# Patient Record
Sex: Female | Born: 1990 | Race: Black or African American | Hispanic: No | Marital: Single | Smoking: Never smoker
Health system: Southern US, Community
[De-identification: ages and names within clinical notes are randomized; demographics above are authoritative.]

## PROBLEM LIST (undated history)

## (undated) ENCOUNTER — Inpatient Hospital Stay (HOSPITAL_COMMUNITY): Payer: Self-pay

## (undated) ENCOUNTER — Emergency Department (HOSPITAL_COMMUNITY): Admission: EM | Payer: Medicaid Other

## (undated) DIAGNOSIS — F32A Depression, unspecified: Secondary | ICD-10-CM

## (undated) DIAGNOSIS — F329 Major depressive disorder, single episode, unspecified: Secondary | ICD-10-CM

## (undated) DIAGNOSIS — L0232 Furuncle of buttock: Secondary | ICD-10-CM

## (undated) DIAGNOSIS — A749 Chlamydial infection, unspecified: Secondary | ICD-10-CM

## (undated) DIAGNOSIS — Z87898 Personal history of other specified conditions: Secondary | ICD-10-CM

## (undated) DIAGNOSIS — F419 Anxiety disorder, unspecified: Secondary | ICD-10-CM

---
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
## 2012-02-05 ENCOUNTER — Emergency Department (HOSPITAL_COMMUNITY): Payer: Medicaid Other

## 2012-02-05 ENCOUNTER — Emergency Department (HOSPITAL_COMMUNITY)
Admission: EM | Admit: 2012-02-05 | Discharge: 2012-02-06 | Disposition: A | Discharge status: Home / Self Care | Payer: Medicaid Other | Attending: Emergency Medicine | Admitting: Emergency Medicine

## 2012-02-05 ENCOUNTER — Encounter (HOSPITAL_COMMUNITY): Payer: Self-pay | Admitting: Emergency Medicine

## 2012-02-05 DIAGNOSIS — S8990XA Unspecified injury of unspecified lower leg, initial encounter: Secondary | ICD-10-CM | POA: Insufficient documentation

## 2012-02-05 DIAGNOSIS — W108XXA Fall (on) (from) other stairs and steps, initial encounter: Secondary | ICD-10-CM | POA: Insufficient documentation

## 2012-02-05 DIAGNOSIS — M79671 Pain in right foot: Secondary | ICD-10-CM

## 2012-02-05 DIAGNOSIS — M79609 Pain in unspecified limb: Secondary | ICD-10-CM | POA: Insufficient documentation

## 2012-02-05 DIAGNOSIS — R229 Localized swelling, mass and lump, unspecified: Secondary | ICD-10-CM | POA: Insufficient documentation

## 2012-02-05 NOTE — ED Notes (Signed)
Pt states she fell down some steps this evening around 7pm  Pt states at first it did not hurt but the more she walked on it the worse the pain got  Pt is c/o pain to the top of the right foot  Minor swelling noted

## 2012-02-06 MED ORDER — TRAMADOL HCL 50 MG PO TABS: 50.0000 mg | ORAL_TABLET | Freq: Four times a day (QID) | ORAL | Status: AC | PRN

## 2012-02-06 MED ORDER — HYDROCODONE-ACETAMINOPHEN 5-325 MG PO TABS
1.0000 | ORAL_TABLET | Freq: Once | ORAL | Status: AC
  Administered 2012-02-06: 1 via ORAL
  Filled 2012-02-06: qty 1

## 2012-02-06 NOTE — Discharge Instructions (Signed)
Your xray showed a possible, tiny, chip off your bone. You've been placed in a walking boot. Apply ice to the area and elevate the foot. Please call and make a follow up appointment with orthopedics. Return to the ER with numbness, weakness, increased pain, or other worrisome symptoms.  RESOURCE GUIDE  Dental Problems  Patients with Medicaid: [facility] [phone]                                           [phone] [address] [employer] [address] Phone:  [phone]                                                  Phone:  [phone]  If unable to pay or uninsured, contact:  [facility] or [facility]. to become qualified for the adult dental clinic.  Chronic Pain Problems Contact [facility]  [phone] Patients need to be referred by their primary care doctor.  Insufficient Money for Medicine Contact [employer]:  call "211" or [facility] [phone].  No Primary Care Doctor Call [facility]  [phone] Other agencies that provide inexpensive medical care    [facility]  [phone]    [facility]  [phone]    [facility]  [phone]    [facility]  [phone]    [facility]  [phone]    [facility]  [phone]  Psychological Services [facility] Behavioral Health  [phone] [facility] Services  [phone] [facility]   [phone] (emergency services [phone])  Substance Abuse Resources [facility]  4420193653 [facility] [phone] [facility] [place] [phone] Floydene Flock [phone] Residential & Outpatient Substance Abuse Program  [phone]  Abuse/Neglect [place] Child Abuse Hotline [phone] [facility] Child Abuse Hotline (580)499-1052 (After Hours)  Emergency Shelter [facility] 8048093791  Maternity Homes [facility] [place] [facility] [place] [phone] [facility] [phone]  MRSA Hotline #:   858-357-1282    [facility] Resources  [facility]     [employer]                          [facility] Dept. [address] [place]                       [address]      [address]  [place]                                                Cristobal Goldmann Phone:  [phone]  Phone:  [phone]                 Phone:  [phone]  [place] [facility] Phone:  [phone]  [place] Child Abuse Hotline (336) 342-1394 [phone] (After Hours)   

## 2012-02-06 NOTE — ED Provider Notes (Signed)
History     CSN: [id number]  Arrival date & time 02/05/12  2310   First MD Initiated Contact with Patient 02/06/12 0015      Chief Complaint  Patient presents with  . Foot Injury    (Consider location/radiation/quality/duration/timing/severity/associated sxs/prior treatment) Patient is a 21 y.o. female presenting with foot injury. The history is provided by the patient.  Foot Injury  The incident occurred 3 to 5 hours ago. The incident occurred at home. The injury mechanism was a fall. The pain is present in the right foot. The quality of the pain is described as aching. The pain is moderate. The pain has been worsening since onset. Associated symptoms include inability to bear weight. Pertinent negatives include no numbness, no loss of motion, no muscle weakness, no loss of sensation and no tingling. She reports no foreign bodies present. The symptoms are aggravated by nothing. She has tried nothing for the symptoms. The treatment provided no relief.  Patient presents with right foot pain. She states that she tripped and fell down about 3 steps this evening about 7 PM. States that she's not sure exactly how she injured her foot in the process. Her foot did not hurt at first, but she later noted increasing pain with weightbearing. She has noted mild swelling to the dorsum of the foot.  History reviewed. No pertinent past medical history.  History reviewed. No pertinent past surgical history.  Family History  Problem Relation Age of Onset  . Hypertension Other     History  Substance Use Topics  . Smoking status: Never Smoker   . Smokeless tobacco: Not on file  . Alcohol Use: No    OB History    Grav Para Term Preterm Abortions TAB SAB Ect Mult Living                  Review of Systems  Constitutional: Negative.   Musculoskeletal: Positive for myalgias. Negative for arthralgias.  Skin: Negative for color change and rash.  Neurological: Negative for tingling and numbness.      Allergies  Review of patient's allergies indicates no known allergies.  Home Medications   Current Outpatient Rx  Name Route Sig Dispense Refill  . LEVONORGEST-ETH ESTRAD 91-DAY 0.15-0.03 MG PO TABS Oral Take 1 tablet by mouth daily.      BP 115/59  Pulse 96  Temp(Src) 97.5 F (36.4 C) (Oral)  Resp 18  SpO2 100%  LMP 01/05/2012  Physical Exam  Nursing note and vitals reviewed. Constitutional: She appears well-developed and well-nourished. No distress.  HENT:  Head: Normocephalic and atraumatic.  Neck: Normal range of motion.  Cardiovascular: Normal rate.   Pulmonary/Chest: Effort normal.  Musculoskeletal: Normal range of motion.       Feet:       Right foot: Soft tissue swelling noted to the medial dorsum of the foot. Tender to palpation over this area. There is no bruising noted. Nontender to palpation to the malleoli. NVI with sensory intact to lt touch, DP/PT pulses intact.  Neurological: She is alert.  Skin: Skin is warm and dry. She is not diaphoretic.  Psychiatric: She has a normal mood and affect.    ED Course  Procedures (including critical care time)  Labs Reviewed - No data to display Dg Foot Complete Right  02/06/2012  *RADIOLOGY REPORT*  Clinical Data: Foot injury.  Fell down steps.  Pain.  RIGHT FOOT COMPLETE - 3+ VIEW  Comparison: None.  Findings: The joints are aligned.  No definite acute fracture is identified.  On the frontal view, there is a tiny, approximately 1 mm bony density projecting medial to the anterior talus and just posterior to the medial navicular bone.  On the oblique view, there is slight irregularity of the anterior calcaneus, with an area of added density.  This projects anterior to the calcaneus on the frontal view.  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  No focal soft tissue swelling is seen.  IMPRESSION: 1. Tiny bony density, approximately 1 mm, medial to the  talonavicular joint on the frontal view.  A tiny avulsion fracture fragment of one of these two bones cannot be excluded.  2. 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 Focal fracture fragment is felt to be less likely.  Suggest clinical correlation to see if the pain the patient has focal pain in the proximal foot.  Original Report Authenticated By: Britta Mccreedy, M.D.     1. Right foot pain       MDM  Patient presents with foot pain. Slight swelling noted to medial dorsum. X-rays, which I personally reviewed and reviewed with the patient, indicate possible tiny, tiny avulsion fracture. Patient will be placed in CAM walker and is to followup with ortho. Return precautions discussed.       Grant Fontana, [place] 02/06/12 [phone]

## 2012-02-06 NOTE — ED Provider Notes (Signed)
Medical screening examination/treatment/procedure(s) were performed by non-physician practitioner and as supervising physician I was immediately available for consultation/collaboration.   Lyanne Co, MD 02/06/12 [phone]

## 2012-03-11 ENCOUNTER — Encounter (HOSPITAL_COMMUNITY): Payer: Self-pay | Admitting: *Deleted

## 2012-03-11 ENCOUNTER — Emergency Department (HOSPITAL_COMMUNITY)
Admission: EM | Admit: 2012-03-11 | Discharge: 2012-03-11 | Disposition: A | Discharge status: Home / Self Care | Payer: Medicaid Other | Attending: Emergency Medicine | Admitting: Emergency Medicine

## 2012-03-11 DIAGNOSIS — M79609 Pain in unspecified limb: Secondary | ICD-10-CM | POA: Insufficient documentation

## 2012-03-11 DIAGNOSIS — L0291 Cutaneous abscess, unspecified: Secondary | ICD-10-CM

## 2012-03-11 DIAGNOSIS — L02419 Cutaneous abscess of limb, unspecified: Secondary | ICD-10-CM | POA: Insufficient documentation

## 2012-03-11 MED ORDER — CEPHALEXIN 500 MG PO CAPS: 500.0000 mg | ORAL_CAPSULE | Freq: Four times a day (QID) | ORAL | Status: AC

## 2012-03-11 MED ORDER — SULFAMETHOXAZOLE-TRIMETHOPRIM 800-160 MG PO TABS: 1.0000 | ORAL_TABLET | Freq: Two times a day (BID) | ORAL | Status: DC

## 2012-03-11 MED ORDER — SULFAMETHOXAZOLE-TRIMETHOPRIM 800-160 MG PO TABS: 1.0000 | ORAL_TABLET | Freq: Two times a day (BID) | ORAL | Status: AC

## 2012-03-11 MED ORDER — CEPHALEXIN 500 MG PO CAPS: 500.0000 mg | ORAL_CAPSULE | Freq: Four times a day (QID) | ORAL | Status: DC

## 2012-03-11 NOTE — ED Notes (Signed)
Pt reports abscess in her R inner thigh since Thursday.  Reports drainage and reports noticing a "hole".

## 2012-03-11 NOTE — ED Notes (Signed)
Pt reports noting an abscess to right inner thigh on Thursday that has since enlarged and has already started to drain. Pt reports pain has increased to area. Pt denies fevers.

## 2012-03-11 NOTE — ED Provider Notes (Signed)
History     CSN: [id number]  Arrival date & time 03/11/12  1459   First MD Initiated Contact with Patient 03/11/12 1550      Chief Complaint  Patient presents with  . Abscess    (Consider location/radiation/quality/duration/timing/severity/associated sxs/prior treatment) HPI Comments: Patient reports that 5 days ago she developed an abscess on her right inner upper thigh.  She has been soaking the abscess in warm water.  Yesterday the abscess broke open and has been draining purulent fluid since that time.  Area is erythematous and tender to palpation.  She reports that she has had recurrent abscesses in this area.  She is not diabetic.  She denies fever or chills.  No nausea or vomiting.    Patient is a 21 y.o. female presenting with abscess. The history is provided by the patient.  Abscess  Pertinent negatives include no fever and no vomiting.    History reviewed. No pertinent past medical history.  History reviewed. No pertinent past surgical history.  Family History  Problem Relation Age of Onset  . Hypertension Other     History  Substance Use Topics  . Smoking status: Never Smoker   . Smokeless tobacco: Not on file  . Alcohol Use: No    OB History    Grav Para Term Preterm Abortions TAB SAB Ect Mult Living                  Review of Systems  Constitutional: Negative for fever and chills.  Respiratory: Negative for shortness of breath.   Gastrointestinal: Negative for nausea and vomiting.  Musculoskeletal: Negative for gait problem.  Skin:       abscess    Allergies  Review of patient's allergies indicates no known allergies.  Home Medications   Current Outpatient Rx  Name Route Sig Dispense Refill  . ACETAMINOPHEN-CODEINE #3 300-30 MG PO TABS Oral Take 1-2 tablets by mouth every 8 (eight) hours as needed. FOR PAIN    . LEVONORGEST-ETH ESTRAD 91-DAY 0.15-0.03 MG PO TABS Oral Take 1 tablet by mouth daily.      BP 119/66  Pulse 101  Temp(Src) 98.8  F (37.1 C) (Oral)  Resp 20  SpO2 100%  LMP 02/10/2012  Physical Exam  Nursing note and vitals reviewed. Constitutional: She appears well-developed and well-nourished. No distress.  HENT:  Head: Normocephalic and atraumatic.  Mouth/Throat: Oropharynx is clear and moist.  Neck: Normal range of motion. Neck supple.  Cardiovascular: Normal rate, regular rhythm and normal heart sounds.   Pulmonary/Chest: Effort normal and breath sounds normal.  Musculoskeletal: Normal range of motion.  Neurological: She is alert.  Skin: Skin is warm and dry. She is not diaphoretic.     Psychiatric: She has a normal mood and affect.    ED Course  Procedures (including critical care time)  Labs Reviewed - No data to display No results found.   No diagnosis found.    MDM  Patient presents with abscess of right upper thigh.  She has been soaking the area in warm water.  The area has broken open completely and is currently draining.  Patient is refusing to have further incision and drainage of the area.  Area is open and draining small amount of fluid.  Therefore, do not feel that there would be great benefit from performing further incision and drainage.  Patient given antibiotic prescription and instructed to return if area worsens or if she develops fever.  Patient in agreement with the plan.  Pascal Lux [place], PA-C 03/12/12 [phone]

## 2012-03-13 NOTE — ED Provider Notes (Signed)
Medical screening examination/treatment/procedure(s) were performed by non-physician practitioner and as supervising physician I was immediately available for consultation/collaboration.  Flint Melter, MD 03/13/12 [phone]

## 2012-10-14 ENCOUNTER — Encounter (HOSPITAL_COMMUNITY): Payer: Self-pay | Admitting: *Deleted

## 2012-10-14 ENCOUNTER — Emergency Department (HOSPITAL_COMMUNITY)
Admission: EM | Admit: 2012-10-14 | Discharge: 2012-10-14 | Disposition: A | Discharge status: Home / Self Care | Payer: Medicaid Other | Attending: Emergency Medicine | Admitting: Emergency Medicine

## 2012-10-14 DIAGNOSIS — L0231 Cutaneous abscess of buttock: Secondary | ICD-10-CM | POA: Insufficient documentation

## 2012-10-14 MED ORDER — HYDROCODONE-ACETAMINOPHEN 5-325 MG PO TABS: 2.0000 | ORAL_TABLET | Freq: Four times a day (QID) | ORAL | Status: DC | PRN

## 2012-10-14 MED ORDER — HYDROCODONE-ACETAMINOPHEN 5-325 MG PO TABS
2.0000 | ORAL_TABLET | Freq: Once | ORAL | Status: AC
  Administered 2012-10-14: 2 via ORAL
  Filled 2012-10-14: qty 2

## 2012-10-14 MED ORDER — IBUPROFEN 600 MG PO TABS: 600.0000 mg | ORAL_TABLET | Freq: Four times a day (QID) | ORAL | Status: DC | PRN

## 2012-10-14 MED ORDER — IBUPROFEN 800 MG PO TABS
800.0000 mg | ORAL_TABLET | Freq: Once | ORAL | Status: AC
  Administered 2012-10-14: 800 mg via ORAL
  Filled 2012-10-14: qty 1

## 2012-10-14 MED ORDER — LIDOCAINE-EPINEPHRINE 2 %-1:100000 IJ SOLN
20.0000 mL | Freq: Once | INTRAMUSCULAR | Status: AC
  Administered 2012-10-14: 20 mL

## 2012-10-14 NOTE — ED Notes (Signed)
Pt c/o abscess at top of buttocks since Friday; painful; fever/tachycardia noted in triage

## 2012-10-14 NOTE — ED Provider Notes (Signed)
History     CSN: [id number]  Arrival date & time 10/14/12  2127   First MD Initiated Contact with Patient 10/14/12 2148      No chief complaint on file.   (Consider location/radiation/quality/duration/timing/severity/associated sxs/prior treatment) HPIShameka A Welch is a 21 y.o. female presents with a hard small area at the top of her buttocks, possible abscess that started on Friday, he is become worse, it is now painful when she sits or walks, the pain is severe, patient has no fever, chills, nausea vomiting, abdominal pain. No history of abscesses previously. No other alleviating or exacerbating factors no other associated symptoms.   History reviewed. No pertinent past medical history.  History reviewed. No pertinent past surgical history.  Family History  Problem Relation Age of Onset  . Hypertension Other     History  Substance Use Topics  . Smoking status: Never Smoker   . Smokeless tobacco: Not on file  . Alcohol Use: No    OB History    Grav Para Term Preterm Abortions TAB SAB Ect Mult Living                  Review of Systems At least 10pt or greater review of systems completed and are negative except where specified in the HPI.  Allergies  Review of patient's allergies indicates no known allergies.  Home Medications  No current outpatient prescriptions on file.  BP 129/80  Pulse 138  Temp 99.9 F (37.7 C)  Resp 20  SpO2 100%  LMP 09/17/2012  Physical Exam  Nursing notes reviewed.  Electronic medical record reviewed. VITAL SIGNS:   Filed Vitals:   10/14/12 2141 10/14/12 2259  BP: 129/80 135/82  Pulse: 138 112  Temp: 99.9 F (37.7 C)   Resp: 20 16  SpO2: 100% 95%   CONSTITUTIONAL: Awake, oriented, appears non-toxic HENT: Atraumatic, normocephalic, oral mucosa pink and moist, airway patent. Nares patent without drainage. External ears normal. EYES: Conjunctiva clear, EOMI, PERRLA NECK: Trachea midline, non-tender,  supple CARDIOVASCULAR: Normal heart rate, Normal rhythm, No murmurs, rubs, gallops PULMONARY/CHEST: Clear to auscultation, no rhonchi, wheezes, or rales. Symmetrical breath sounds. Non-tender. ABDOMINAL: Non-distended, soft, obese, non-tender - no rebound or guarding.  BS normal. Back: Large indurated area at the superior gluteal cleft with a central area of fluctuance - the lesion is located mostly on the left side which extends over the cleft and slightly into the right aspect of the superior gluteus region. Ultrasound use for further characterization. NEUROLOGIC: Non-focal, moving all four extremities, no gross sensory or motor deficits. EXTREMITIES: No clubbing, cyanosis, or edema SKIN: Warm, Dry, No erythema, No rash  ED Course  [place] bedside Performed by: Jones Skene Authorized by: Jones Skene Consent: Verbal consent obtained. Comments: Abscess at the top of gluteal cleft with induration noted mostly on the left aspect with mostly abscess body in the gluteal cleft and to the right of the gluteal cleft, period of fluid pocket is 2 x 1.6 with some areas of septation.  INCISION AND DRAINAGE Performed by: Jones Skene Authorized by: Jones Skene Consent: Verbal consent obtained. Risks and benefits: risks, benefits and alternatives were discussed Consent given by: patient Patient identity confirmed: verbally with patient Time out: Immediately prior to procedure a "time out" was called to verify the correct patient, procedure, equipment, support staff and site/side marked as required. Type: abscess Body area: anogenital Location details: gluteal cleft Anesthesia: local infiltration Local anesthetic: lidocaine 2% with epinephrine Anesthetic total: 10 ml Patient sedated:  no Scalpel size: 11 Incision type: single straight Complexity: simple (Multiple loculations broken up with a hemostat) Drainage: purulent and serosanguinous Drainage amount: copious Wound treatment: wound  left open Packing material: none Patient tolerance: Patient tolerated the procedure well with no immediate complications.   (including critical care time)  Labs Reviewed - No data to display No results found.   1. Abscess, gluteal cleft     MDM  Tammy Welch is a 21 y.o. female resenting with abscess well characterized on ultrasound with multiple loculations. Patient tolerated procedure well-see above documentation. There is no area of cellulitis, antibiotics will not be needed. Packing is not indicated. She is not febrile. Patient is mildly tachycardic on discharge, likely due to pain, she given some by mouth pain medicine prior to discharge. Patient is given some pain medicine also a prescription.  I explained the diagnosis and have given explicit precautions to return to the ER including signs of local infection or systemic infection or any other new or worsening symptoms. The patient understands and accepts the medical plan as it's been dictated and I have answered their questions. Discharge instructions concerning home care and prescriptions have been given.  The patient is STABLE and is discharged to home in good condition.          Jones Skene, MD 10/15/12 0236

## 2013-03-30 ENCOUNTER — Emergency Department (HOSPITAL_COMMUNITY)
Admission: EM | Admit: 2013-03-30 | Discharge: 2013-03-30 | Disposition: A | Discharge status: Home / Self Care | Payer: Self-pay | Attending: Emergency Medicine | Admitting: Emergency Medicine

## 2013-03-30 ENCOUNTER — Encounter (HOSPITAL_COMMUNITY): Payer: Self-pay

## 2013-03-30 DIAGNOSIS — L0291 Cutaneous abscess, unspecified: Secondary | ICD-10-CM

## 2013-03-30 DIAGNOSIS — Z872 Personal history of diseases of the skin and subcutaneous tissue: Secondary | ICD-10-CM | POA: Insufficient documentation

## 2013-03-30 DIAGNOSIS — IMO0002 Reserved for concepts with insufficient information to code with codable children: Secondary | ICD-10-CM | POA: Insufficient documentation

## 2013-03-30 HISTORY — DX: Furuncle of buttock: L02.32

## 2013-03-30 MED ORDER — SULFAMETHOXAZOLE-TRIMETHOPRIM 800-160 MG PO TABS: 1.0000 | ORAL_TABLET | Freq: Two times a day (BID) | ORAL | Status: DC

## 2013-03-30 MED ORDER — HYDROCODONE-ACETAMINOPHEN 5-325 MG PO TABS: 1.0000 | ORAL_TABLET | Freq: Three times a day (TID) | ORAL | Status: DC | PRN

## 2013-03-30 MED ORDER — CEPHALEXIN 500 MG PO CAPS: 500.0000 mg | ORAL_CAPSULE | Freq: Four times a day (QID) | ORAL | Status: DC

## 2013-03-30 NOTE — ED Provider Notes (Signed)
History    This chart was scribed for non-physician practitioner Raymon Mutton PA-C working with Hurman Horn, MD by Smitty Pluck, ED scribe. This patient was seen in room WTR5/WTR5 and the patient's care was started at 7:39 PM.   CSN: [id number]  Arrival date & time 03/30/13  1826    Chief Complaint  Patient presents with  . Recurrent Skin Infections     The history is provided by the patient and medical records. No language interpreter was used.   HPI Comments: Tammy Welch is a 22 y.o. female who presents to the Emergency Department complaining of right axilla pain due to abscess in right axilla radiating to right arm and right shoulder onset 2 days ago. She reports hx of abscesses (last one was in December 2013 on buttocks). She reports that movement of right arm aggravates the pain. She reports that she has used ointment on the abscess without relief. Pt denies drainage, fever, chills, nausea, vomiting, diarrhea, weakness, cough, SOB and any other pain.   Past Medical History  Diagnosis Date  . Boil of buttock     History reviewed. No pertinent past surgical history.  Family History  Problem Relation Age of Onset  . Hypertension Other     History  Substance Use Topics  . Smoking status: Never Smoker   . Smokeless tobacco: Not on file  . Alcohol Use: No    OB History   Grav Para Term Preterm Abortions TAB SAB Ect Mult Living                  Review of Systems  Constitutional: Negative for fever and chills.  HENT: Negative for sore throat, trouble swallowing, neck pain and neck stiffness.   Respiratory: Negative for shortness of breath.   Cardiovascular: Negative for chest pain.  Gastrointestinal: Negative for nausea and vomiting.  Skin:       Abscess   Neurological: Negative for dizziness, weakness and headaches.  All other systems reviewed and are negative.    Allergies  Review of patient's allergies indicates no known allergies.  Home  Medications   Current Outpatient Rx  Name  Route  Sig  Dispense  Refill  . cephALEXin (KEFLEX) 500 MG capsule   Oral   Take 1 capsule (500 mg total) by mouth 4 (four) times daily.   28 capsule   0   . sulfamethoxazole-trimethoprim (BACTRIM DS,SEPTRA DS) 800-160 MG per tablet   Oral   Take 1 tablet by mouth 2 (two) times daily. One po bid x 7 days   14 tablet   0     BP 113/69  Pulse 104  Temp(Src) 98.6 F (37 C) (Oral)  Resp 17  Ht 5\' 2"  (1.575 m)  Wt 173 lb (78.472 kg)  BMI 31.63 kg/m2  SpO2 99%  LMP 03/16/2013  Physical Exam  Nursing note and vitals reviewed. Constitutional: She is oriented to person, place, and time. She appears well-developed and well-nourished. No distress.  HENT:  Head: Normocephalic and atraumatic.  Mouth/Throat: Oropharynx is clear and moist.  Eyes: Conjunctivae and EOM are normal. Pupils are equal, round, and reactive to light.  Neck: Normal range of motion. Neck supple.  No nuchal rigidity No masses No neck stiffness   Cardiovascular: Normal rate, regular rhythm and normal heart sounds.   No murmur heard. Pulses:      Radial pulses are 2+ on the right side, and 2+ on the left side.  Pulmonary/Chest: Effort normal and  breath sounds normal. No respiratory distress. She has no wheezes. She has no rales.  Musculoskeletal: Normal range of motion. She exhibits tenderness.  Pain with motion to the right shoulder secondary to pain from abscess  Lymphadenopathy:    She has no cervical adenopathy.  Neurological: She is alert and oriented to person, place, and time.  Skin: Skin is warm and dry.  Approximately 2 cm x 2 cm abscess, indurated noted to the right axilla Mild erythema and swelling Negative drainage Pain upon palpation to site  Psychiatric: She has a normal mood and affect. Her behavior is normal. Thought content normal.    ED Course  [place] bedside Date/Time: 03/30/2013 7:40 PM Performed by: Raymon Mutton Authorized by: Raymon Mutton Consent: Verbal consent obtained. Consent given by: patient Patient identity confirmed: verbally with patient and arm band Comments: [place] bedside used to detect fluid in indurated abscess of right axilla - negative fluid accumulation noted   (including critical care time) DIAGNOSTIC STUDIES: Oxygen Saturation is 99% on room air, normal by my interpretation.    COORDINATION OF CARE: 7:43 PM Discussed ED treatment with pt and pt agrees.     Labs Reviewed - No data to display No results found.   1. Abscess       MDM  I personally performed the services described in this documentation, which was scribed in my presence. The recorded information has been reviewed and is accurate.  Patient presenting with abscess to right axilla - US guided assessment noted - negative fluid, hypoechoic fluid noted - drainage not performed. Patient stable, afebrile, non-toxic appearing. Patient discharged. Discharged with antibiotics. Small dose of pain medications given - discussed course, precautions and disposal techniques. Discussed warm compressions. Referred to general surgery. Discussed with patient to monitor symptoms and if symptoms are to worsen or change to report back to the ED - strict return instructions given. Patient agreed to plan of care, understood, all questions answered.     Raymon Mutton, PA-C 03/31/13 [address] PA-C 03/31/13 2120

## 2013-03-30 NOTE — ED Notes (Signed)
rx x 3 given for hydrocodone, septra and keflex- note for school given- pt has a ride

## 2013-03-30 NOTE — ED Notes (Signed)
Pt presents with NAD- boil to rt under arm x 1 day  HX of the same

## 2013-03-31 ENCOUNTER — Telehealth (HOSPITAL_COMMUNITY): Payer: Self-pay | Admitting: Emergency Medicine

## 2013-03-31 NOTE — ED Provider Notes (Signed)
Medical screening examination/treatment/procedure(s) were performed by non-physician practitioner and as supervising physician I was immediately available for consultation/collaboration.   Hurman Horn, MD 03/31/13 [phone]

## 2013-03-31 NOTE — ED Notes (Signed)
Pt called regarding referral to [facility].  Stated [facility] would not see her as her Medicaid has not been approved yet.  Went over [employer] instructions with pt again letting her know to return as needed as PA who saw her told her  As well and to f/u w/Medicaid to see if they can give her something indicating she will be approved that would be acceptable to [facility].

## 2013-04-04 ENCOUNTER — Emergency Department (HOSPITAL_COMMUNITY)
Admission: EM | Admit: 2013-04-04 | Discharge: 2013-04-04 | Disposition: A | Discharge status: Home / Self Care | Payer: Self-pay | Attending: Emergency Medicine | Admitting: Emergency Medicine

## 2013-04-04 ENCOUNTER — Encounter (HOSPITAL_COMMUNITY): Payer: Self-pay | Admitting: *Deleted

## 2013-04-04 DIAGNOSIS — L0291 Cutaneous abscess, unspecified: Secondary | ICD-10-CM

## 2013-04-04 DIAGNOSIS — Z872 Personal history of diseases of the skin and subcutaneous tissue: Secondary | ICD-10-CM | POA: Insufficient documentation

## 2013-04-04 DIAGNOSIS — IMO0002 Reserved for concepts with insufficient information to code with codable children: Secondary | ICD-10-CM | POA: Insufficient documentation

## 2013-04-04 DIAGNOSIS — Z3202 Encounter for pregnancy test, result negative: Secondary | ICD-10-CM | POA: Insufficient documentation

## 2013-04-04 NOTE — ED Provider Notes (Signed)
History     CSN: [id number]  Arrival date & time 04/04/13  2004   First MD Initiated Contact with Patient 04/04/13 2017      Chief Complaint  Patient presents with  . Abscess    under rt  arm    (Consider location/radiation/quality/duration/timing/severity/associated sxs/prior treatment) HPI Comments: Patient presents with complaint of right axillary abscess for the past 2 weeks. She has a history of abscesses that needed an I&D in the past. Patient was seen in emergency department several days ago and had ultrasound which did not show fluid collection. She was discharged home on Bactrim and Keflex which he has discontinued due to stomach pain and nausea. She has taken Vicodin with some relief. She denies fever. She denies recent vomiting. Onset of symptoms gradual. Course is gradually worsening. Nothing makes symptoms better or worse.  Patient is a 22 y.o. female presenting with abscess. The history is provided by the patient.  Abscess Associated symptoms: no fever, no nausea and no vomiting     Past Medical History  Diagnosis Date  . Boil of buttock     History reviewed. No pertinent past surgical history.  Family History  Problem Relation Age of Onset  . Hypertension Other     History  Substance Use Topics  . Smoking status: Never Smoker   . Smokeless tobacco: Not on file  . Alcohol Use: No    OB History   Grav Para Term Preterm Abortions TAB SAB Ect Mult Living                  Review of Systems  Constitutional: Negative for fever.  Gastrointestinal: Negative for nausea and vomiting.  Skin: Negative for color change.       Positive for abscess.  Hematological: Negative for adenopathy.    Allergies  Review of patient's allergies indicates no known allergies.  Home Medications   Current Outpatient Rx  Name  Route  Sig  Dispense  Refill  . cephALEXin (KEFLEX) 500 MG capsule   Oral   Take 1 capsule (500 mg total) by mouth 4 (four) times daily.   28  capsule   0   . HYDROcodone-acetaminophen (NORCO) 5-325 MG per tablet   Oral   Take 1 tablet by mouth every 8 (eight) hours as needed for pain.   6 tablet   0   . sulfamethoxazole-trimethoprim (BACTRIM DS,SEPTRA DS) 800-160 MG per tablet   Oral   Take 1 tablet by mouth 2 (two) times daily. One po bid x 7 days   14 tablet   0     BP 109/65  Pulse 90  Temp(Src) 98.6 F (37 C) (Oral)  Resp 16  SpO2 99%  LMP 03/16/2013  Physical Exam  Nursing note and vitals reviewed. Constitutional: She appears well-developed and well-nourished.  HENT:  Head: Normocephalic and atraumatic.  Eyes: Conjunctivae are normal.  Neck: Normal range of motion. Neck supple.  Pulmonary/Chest: No respiratory distress.  Neurological: She is alert.  Skin: Skin is warm and dry.  3 cm x 1 cm area of induration of right axilla consistent with abscess. No cellulitis or current drainage. Area is very tender to palpation.  Psychiatric: She has a normal mood and affect.    ED Course  Procedures (including critical care time)  Labs Reviewed  POCT PREGNANCY, URINE   No results found.   1. Abscess     8:40 PM Patient seen and examined. Pt agrees to go ahead with I&D.  Vital signs reviewed and are as follows: Filed Vitals:   04/04/13 2024  BP: 109/65  Pulse: 90  Temp: 98.6 F (37 C)  Resp: 16   INCISION AND DRAINAGE Performed by: Carolee Rota Consent: Verbal consent obtained. Risks and benefits: risks, benefits and alternatives were discussed Type: abscess  Body area: R axilla  Anesthesia: local infiltration  Incision was made with a scalpel.  Local anesthetic: lidocaine 2% with epinephrine  Anesthetic total: 3 ml  Complexity: complex Blunt dissection to break up loculations  Drainage: purulent  Drainage amount: large  Packing material: none  Patient tolerance: Patient tolerated the procedure well with no immediate complications.  The patient was urged to return to the  Emergency Department urgently with worsening pain, swelling, expanding erythema especially if it streaks away from the affected area, fever, or if they have any other concerns.   The patient was urged to return to the Emergency Department or go to their PCP in 48 hours for wound recheck if the area is not significantly improved.  Patient was told okay to d/c antibiotics due to intolerance.  The patient verbalized understanding and stated agreement with this plan.    MDM  Patient with skin abscess amenable to incision and drainage.  No systemic sx of illness. No signs of cellulitis in surrounding skin.  Will d/c to home.  No antibiotic therapy is indicated.         Renne Crigler, PA-C 04/05/13 [phone]

## 2013-04-04 NOTE — ED Notes (Signed)
abcess in not red - is normal in color - located in folds of right arm pit. Pain on movement 10/10

## 2013-04-06 LAB — POCT PREGNANCY, URINE: Preg Test, Ur: NEGATIVE

## 2013-04-06 NOTE — ED Provider Notes (Signed)
Medical screening examination/treatment/procedure(s) were performed by non-physician practitioner and as supervising physician I was immediately available for consultation/collaboration.  Sagar Tengan R. Daman Steffenhagen, MD 04/06/13 2323 

## 2013-07-09 ENCOUNTER — Emergency Department (HOSPITAL_COMMUNITY)
Admission: EM | Admit: 2013-07-09 | Discharge: 2013-07-09 | Disposition: A | Discharge status: Home / Self Care | Payer: Medicaid Other | Attending: Emergency Medicine | Admitting: Emergency Medicine

## 2013-07-09 ENCOUNTER — Encounter (HOSPITAL_COMMUNITY): Payer: Self-pay | Admitting: *Deleted

## 2013-07-09 DIAGNOSIS — J029 Acute pharyngitis, unspecified: Secondary | ICD-10-CM

## 2013-07-09 MED ORDER — HYDROCODONE-ACETAMINOPHEN 7.5-325 MG/15ML PO SOLN: 15.0000 mL | Freq: Four times a day (QID) | ORAL | Status: AC | PRN

## 2013-07-09 MED ORDER — CLINDAMYCIN HCL 300 MG PO CAPS
450.0000 mg | ORAL_CAPSULE | Freq: Once | ORAL | Status: AC
  Administered 2013-07-09: 450 mg via ORAL
  Filled 2013-07-09: qty 1

## 2013-07-09 MED ORDER — OXYCODONE-ACETAMINOPHEN 5-325 MG PO TABS
2.0000 | ORAL_TABLET | Freq: Once | ORAL | Status: AC
  Administered 2013-07-09: 2 via ORAL
  Filled 2013-07-09: qty 2

## 2013-07-09 MED ORDER — KETOROLAC TROMETHAMINE 60 MG/2ML IM SOLN
60.0000 mg | Freq: Once | INTRAMUSCULAR | Status: AC
Start: 2013-07-09 — End: 2013-07-09
  Administered 2013-07-09: 60 mg via INTRAMUSCULAR
  Filled 2013-07-09: qty 2

## 2013-07-09 MED ORDER — CLINDAMYCIN HCL 300 MG PO CAPS: 300.0000 mg | ORAL_CAPSULE | Freq: Four times a day (QID) | ORAL | Status: DC

## 2013-07-09 NOTE — ED Provider Notes (Signed)
CSN: [id number]     Arrival date & time 07/09/13  0901 History   First MD Initiated Contact with Patient 07/09/13 0910     No chief complaint on file.   HPI Patient reports sore throat over the past 7-9 days.  No fever nausea or vomiting.  Mom reports having sore throat last week as well.  Patient reports pain with talking.  She has been able to eat and drink fluids.  She comes in with normal vital signs.  She denies chest pain shortness of breath.  She denies back pain.  She does report mild frontal headache.  She also has runny nose.  Symptoms are mild to moderate in severity.  Nothing improves or worsens her symptoms   Past Medical History  Diagnosis Date  . Boil of buttock    History reviewed. No pertinent past surgical history. Family History  Problem Relation Age of Onset  . Hypertension Other    History  Substance Use Topics  . Smoking status: Never Smoker   . Smokeless tobacco: Not on file  . Alcohol Use: No   OB History   Grav Para Term Preterm Abortions TAB SAB Ect Mult Living                 Review of Systems  All other systems reviewed and are negative.    Allergies  Review of patient's allergies indicates no known allergies.  Home Medications   Current Outpatient Rx  Name  Route  Sig  Dispense  Refill  . HYDROcodone-acetaminophen (NORCO) 5-325 MG per tablet   Oral   Take 1 tablet by mouth every 8 (eight) hours as needed for pain.   6 tablet   0    BP 111/73  Pulse 86  Temp(Src) 98.4 F (36.9 C) (Oral)  Resp 20  SpO2 100% Physical Exam  Nursing note and vitals reviewed. Constitutional: She is oriented to person, place, and time. She appears well-developed and well-nourished. No distress.  HENT:  Head: Normocephalic and atraumatic.  Posterior pharynx is normal in appearance except for mild erythema.  Uvula is midline.  No tonsillar swelling or exudate.  Tolerating secretions.  Oral airway is patent.   Eyes: EOM are normal.  Neck: Normal range of  motion.  Cardiovascular: Normal rate, regular rhythm and normal heart sounds.   Pulmonary/Chest: Effort normal and breath sounds normal.  Abdominal: Soft. She exhibits no distension. There is no tenderness.  Musculoskeletal: Normal range of motion.  Neurological: She is alert and oriented to person, place, and time.  Skin: Skin is warm and dry.  Psychiatric: She has a normal mood and affect. Judgment normal.    ED Course  Procedures (including critical care time) Labs Review Labs Reviewed - No data to display Imaging Review No results found.  MDM  No diagnosis found. No evidence of peritonsillar abscess at this time.  She's had prolonged sore throat and therefore the patient be prescribed a short course of clindamycin.  Home with pain medication.  Close followup in the emergency department if things seem to change or worsen.    Lyanne Co, MD 07/09/13 1032

## 2013-07-09 NOTE — ED Notes (Signed)
Pt presents to ed with c/o sore throat, and frontal headache. Pt reports runny nose and dry cough. Denies fever, nausea and vomiting. Mom at bedside reports she had strep throat last week and thinks pt has it too. Pt is tearful.

## 2014-03-25 DIAGNOSIS — F1291 Cannabis use, unspecified, in remission: Secondary | ICD-10-CM

## 2014-03-25 DIAGNOSIS — Z87898 Personal history of other specified conditions: Secondary | ICD-10-CM

## 2014-03-25 HISTORY — DX: Personal history of other specified conditions: Z87.898

## 2014-03-25 HISTORY — DX: Cannabis use, unspecified, in remission: F12.91

## 2014-05-09 ENCOUNTER — Emergency Department (HOSPITAL_COMMUNITY)
Admission: EM | Admit: 2014-05-09 | Discharge: 2014-05-10 | Disposition: A | Discharge status: Home / Self Care | Payer: Medicaid Other | Attending: Emergency Medicine | Admitting: Emergency Medicine

## 2014-05-09 ENCOUNTER — Emergency Department (HOSPITAL_COMMUNITY): Payer: Medicaid Other

## 2014-05-09 ENCOUNTER — Encounter (HOSPITAL_COMMUNITY): Payer: Self-pay | Admitting: Emergency Medicine

## 2014-05-09 DIAGNOSIS — Z3202 Encounter for pregnancy test, result negative: Secondary | ICD-10-CM | POA: Diagnosis not present

## 2014-05-09 DIAGNOSIS — R1011 Right upper quadrant pain: Secondary | ICD-10-CM | POA: Diagnosis present

## 2014-05-09 DIAGNOSIS — Z872 Personal history of diseases of the skin and subcutaneous tissue: Secondary | ICD-10-CM | POA: Insufficient documentation

## 2014-05-09 DIAGNOSIS — Z792 Long term (current) use of antibiotics: Secondary | ICD-10-CM | POA: Insufficient documentation

## 2014-05-09 DIAGNOSIS — Z79899 Other long term (current) drug therapy: Secondary | ICD-10-CM | POA: Diagnosis not present

## 2014-05-09 DIAGNOSIS — R112 Nausea with vomiting, unspecified: Secondary | ICD-10-CM | POA: Insufficient documentation

## 2014-05-09 LAB — CBC WITH DIFFERENTIAL/PLATELET
BASOS ABS: 0 10*3/uL (ref 0.0–0.1)
Basophils Relative: 0 % (ref 0–1)
EOS PCT: 1 % (ref 0–5)
Eosinophils Absolute: 0.1 10*3/uL (ref 0.0–0.7)
HEMATOCRIT: 39.8 % (ref 36.0–46.0)
Hemoglobin: 13.1 g/dL (ref 12.0–15.0)
Lymphocytes Relative: 34 % (ref 12–46)
Lymphs Abs: 2.2 10*3/uL (ref 0.7–4.0)
MCH: 28.4 pg (ref 26.0–34.0)
MCHC: 32.9 g/dL (ref 30.0–36.0)
MCV: 86.1 fL (ref 78.0–100.0)
MONO ABS: 0.6 10*3/uL (ref 0.1–1.0)
Monocytes Relative: 9 % (ref 3–12)
Neutro Abs: 3.6 10*3/uL (ref 1.7–7.7)
Neutrophils Relative %: 56 % (ref 43–77)
Platelets: 237 10*3/uL (ref 150–400)
RBC: 4.62 MIL/uL (ref 3.87–5.11)
RDW: 13 % (ref 11.5–15.5)
WBC: 6.5 10*3/uL (ref 4.0–10.5)

## 2014-05-09 LAB — URINALYSIS, ROUTINE W REFLEX MICROSCOPIC
Bilirubin Urine: NEGATIVE
Glucose, UA: NEGATIVE mg/dL
Ketones, ur: NEGATIVE mg/dL
Leukocytes, UA: NEGATIVE
Nitrite: NEGATIVE
Protein, ur: NEGATIVE mg/dL
Specific Gravity, Urine: 1.028 (ref 1.005–1.030)
Urobilinogen, UA: 1 mg/dL (ref 0.0–1.0)
pH: 6 (ref 5.0–8.0)

## 2014-05-09 LAB — URINE MICROSCOPIC-ADD ON

## 2014-05-09 LAB — POC URINE PREG, ED: Preg Test, Ur: NEGATIVE

## 2014-05-09 LAB — LIPASE, BLOOD: LIPASE: 42 U/L (ref 11–59)

## 2014-05-09 MED ORDER — ONDANSETRON 8 MG PO TBDP
8.0000 mg | ORAL_TABLET | Freq: Once | ORAL | Status: AC
Start: 2014-05-09 — End: 2014-05-09
  Administered 2014-05-09: 8 mg via ORAL
  Filled 2014-05-09: qty 1

## 2014-05-09 MED ORDER — OXYCODONE HCL 5 MG PO TABS
5.0000 mg | ORAL_TABLET | Freq: Once | ORAL | Status: AC
  Administered 2014-05-09: 5 mg via ORAL
  Filled 2014-05-09: qty 1

## 2014-05-09 NOTE — ED Provider Notes (Signed)
CSN: [id number]     Arrival date & time 05/09/14  2018 History   First MD Initiated Contact with Patient 05/09/14 2302     Chief Complaint  Patient presents with  . Abdominal Pain     (Consider location/radiation/quality/duration/timing/severity/associated sxs/prior Treatment) HPI Comments: Patient is a 23 year old female who presents to the emergency department today for 4 days of gradually worsening right upper abdominal pain. She states that she has had associated nausea and today she vomited stomach contents. Her emesis is nonbilious, nonbloody. Patient can give a quality to the pain only of "the pain feels like it's my kidney". She has never had any kidney problems in the past and appears to be confused about where her kidney is. She denies any urinary symptoms. No dysuria, urinary urgency, urinary frequency, decreased urination. The pain is constant. She reports that she eats a lot of baked chicken. She has never had any abdominal surgeries in still has her gallbladder and appendix. No fevers, chills, diarrhea.   Patient is a 23 y.o. female presenting with abdominal pain. The history is provided by the patient. No language interpreter was used.  Abdominal Pain Associated symptoms: nausea and vomiting   Associated symptoms: no chest pain, no chills, no diarrhea, no dysuria, no fever, no shortness of breath and no vaginal discharge     Past Medical History  Diagnosis Date  . Boil of buttock    History reviewed. No pertinent past surgical history. Family History  Problem Relation Age of Onset  . Hypertension Other    History  Substance Use Topics  . Smoking status: Never Smoker   . Smokeless tobacco: Not on file  . Alcohol Use: No   OB History   Grav Para Term Preterm Abortions TAB SAB Ect Mult Living                 Review of Systems  Constitutional: Negative for fever and chills.  Respiratory: Negative for shortness of breath.   Cardiovascular: Negative for chest pain.   Gastrointestinal: Positive for nausea, vomiting and abdominal pain. Negative for diarrhea.  Genitourinary: Negative for dysuria, frequency, vaginal discharge and difficulty urinating.  All other systems reviewed and are negative.     Allergies  Review of patient's allergies indicates no known allergies.  Home Medications   Prior to Admission medications   Medication Sig Start Date End Date Taking? Authorizing Provider  clindamycin (CLEOCIN) 300 MG capsule Take 1 capsule (300 mg total) by mouth 4 (four) times daily. 07/09/13   Lyanne CoKevin M Campos, MD  HYDROcodone-acetaminophen (HYCET) 7.5-325 mg/15 ml solution Take 15 mLs by mouth 4 (four) times daily as needed for pain. 07/09/13 07/09/14  Lyanne CoKevin M Campos, MD  HYDROcodone-acetaminophen (NORCO) 5-325 MG per tablet Take 1 tablet by mouth every 8 (eight) hours as needed for pain. 03/30/13   Marissa Sciacca, PA-C   BP 126/64  Pulse 90  Temp(Src) 98.7 F (37.1 C) (Oral)  Resp 16  Ht 5\' 2"  (1.575 m)  SpO2 99%  LMP 05/02/2014 Physical Exam  Nursing note and vitals reviewed. Constitutional: She is oriented to person, place, and time. She appears well-developed and well-nourished. No distress.  HENT:  Head: Normocephalic and atraumatic.  Right Ear: External ear normal.  Left Ear: External ear normal.  Nose: Nose normal.  Mouth/Throat: Oropharynx is clear and moist.  Eyes: Conjunctivae are normal.  Neck: Normal range of motion.  Cardiovascular: Normal rate, regular rhythm and normal heart sounds.   Pulmonary/Chest: Effort normal and  breath sounds normal. No stridor. No respiratory distress. She has no wheezes. She has no rales.  Abdominal: Soft. Bowel sounds are normal. She exhibits no distension. There is tenderness in the right upper quadrant.  Abdomen is soft, non surgical   Musculoskeletal: Normal range of motion.  Neurological: She is alert and oriented to person, place, and time. She has normal strength.  Skin: Skin is warm and dry. She  is not diaphoretic. No erythema.  Psychiatric: She has a normal mood and affect. Her behavior is normal.    ED Course  Procedures (including critical care time) Labs Review Labs Reviewed  URINALYSIS, ROUTINE W REFLEX MICROSCOPIC - Abnormal; Notable for the following:    Hgb urine dipstick SMALL (*)    All other components within normal limits  COMPREHENSIVE METABOLIC PANEL - Abnormal; Notable for the following:    Glucose, Bld 101 (*)    Total Bilirubin 0.2 (*)    All other components within normal limits  URINE MICROSCOPIC-ADD ON - Abnormal; Notable for the following:    Squamous Epithelial / LPF FEW (*)    Bacteria, UA MANY (*)    All other components within normal limits  CBC WITH DIFFERENTIAL  LIPASE, BLOOD  POC URINE PREG, ED    Imaging Review US Abdomen Complete  05/10/2014   CLINICAL DATA:  Right upper quadrant abdominal pain.  EXAM: ULTRASOUND ABDOMEN COMPLETE  COMPARISON:  None.  FINDINGS: Gallbladder:  No gallstones or wall thickening visualized. No sonographic Murphy sign noted.  Common bile duct:  Diameter: 2.7 mm.  No evidence of choledocholithiasis.  Liver:  No focal lesion identified. Within normal limits in parenchymal echogenicity.  IVC:  No abnormality visualized.  Pancreas:  The visualized portions are unremarkable. The pancreatic tail is partly obscured by bowel gas.  Spleen:  Size and appearance within normal limits.  Right Kidney:  Length: 10.2 cm. Echogenicity within normal limits. No mass or hydronephrosis visualized.  Left Kidney:  Length: 10.2 cm. Echogenicity within normal limits. No mass or hydronephrosis visualized.  Abdominal aorta:  No aneurysm visualized.  Other findings:  None.  IMPRESSION: No acute abdominal findings.   Electronically Signed   By: Roxy Horseman M.D.   On: 05/10/2014 00:22     EKG Interpretation None      MDM   Final diagnoses:  Right upper quadrant pain    Patient is nontoxic, nonseptic appearing, in no apparent distress.   Patient's pain and other symptoms adequately managed in emergency department.  Labs, imaging and vitals reviewed.  Patient does not meet the SIRS or Sepsis criteria.  On repeat exam patient does not have a surgical abdomen and there are no peritoneal signs.  No indication of appendicitis, bowel obstruction, bowel perforation, cholecystitis, diverticulitis, PID or ectopic pregnancy.  Patient discharged home with symptomatic treatment and given strict instructions for follow-up with their primary care physician.  I have also discussed reasons to return immediately to the ER.  Patient expresses understanding and agrees with plan.       Mora Bellman, PA-C 05/10/14 0157

## 2014-05-09 NOTE — ED Notes (Signed)
Pt complains of right lower abd pain for three days, she state that shes nauseated

## 2014-05-10 LAB — COMPREHENSIVE METABOLIC PANEL
ALT: 13 U/L (ref 0–35)
AST: 14 U/L (ref 0–37)
Albumin: 4.3 g/dL (ref 3.5–5.2)
Alkaline Phosphatase: 50 U/L (ref 39–117)
Anion gap: 13 (ref 5–15)
BUN: 13 mg/dL (ref 6–23)
CALCIUM: 9.5 mg/dL (ref 8.4–10.5)
CO2: 25 meq/L (ref 19–32)
CREATININE: 0.8 mg/dL (ref 0.50–1.10)
Chloride: 104 mEq/L (ref 96–112)
GFR calc non Af Amer: >90 mL/min (ref >90)
Glucose, Bld: 101 mg/dL — ABNORMAL HIGH (ref 70–99)
Potassium: 3.8 mEq/L (ref 3.7–5.3)
Sodium: 142 mEq/L (ref 137–147)
Total Bilirubin: 0.2 mg/dL — ABNORMAL LOW (ref 0.3–1.2)
Total Protein: 7.8 g/dL (ref 6.0–8.3)

## 2014-05-10 MED ORDER — TRAMADOL HCL 50 MG PO TABS: 50.0000 mg | ORAL_TABLET | Freq: Four times a day (QID) | ORAL | Status: DC | PRN

## 2014-05-10 MED ORDER — PROMETHAZINE HCL 25 MG PO TABS: 25.0000 mg | ORAL_TABLET | Freq: Four times a day (QID) | ORAL | Status: DC | PRN

## 2014-05-10 MED ORDER — DIAZEPAM 5 MG PO TABS: 5.0000 mg | ORAL_TABLET | Freq: Four times a day (QID) | ORAL | Status: DC | PRN

## 2014-05-10 NOTE — Discharge Instructions (Signed)
Abdominal Pain, Women °Abdominal (stomach, pelvic, or belly) pain can be caused by many things. It is important to tell your doctor: °· The location of the pain. °· Does it come and go or is it present all the time? °· Are there things that start the pain (eating certain foods, exercise)? °· Are there other symptoms associated with the pain (fever, nausea, vomiting, diarrhea)? °All of this is helpful to know when trying to find the cause of the pain. °CAUSES  °· Stomach: virus or bacteria infection, or ulcer. °· Intestine: appendicitis (inflamed appendix), regional ileitis (Crohn's disease), ulcerative colitis (inflamed colon), irritable bowel syndrome, diverticulitis (inflamed diverticulum of the colon), or cancer of the stomach or intestine. °· Gallbladder disease or stones in the gallbladder. °· Kidney disease, kidney stones, or infection. °· Pancreas infection or cancer. °· Fibromyalgia (pain disorder). °· Diseases of the female organs: °¨ Uterus: fibroid (non-cancerous) tumors or infection. °¨ Fallopian tubes: infection or tubal pregnancy. °¨ Ovary: cysts or tumors. °¨ Pelvic adhesions (scar tissue). °¨ Endometriosis (uterus lining tissue growing in the pelvis and on the pelvic organs). °¨ Pelvic congestion syndrome (female organs filling up with blood just before the menstrual period). °¨ Pain with the menstrual period. °¨ Pain with ovulation (producing an egg). °¨ Pain with an IUD (intrauterine device, birth control) in the uterus. °¨ Cancer of the female organs. °· Functional pain (pain not caused by a disease, may improve without treatment). °· Psychological pain. °· Depression. °DIAGNOSIS  °Your doctor will decide the seriousness of your pain by doing an examination. °· Blood tests. °· X-rays. °· Ultrasound. °· CT scan (computed tomography, special type of X-ray). °· MRI (magnetic resonance imaging). °· Cultures, for infection. °· Barium enema (dye inserted in the large intestine, to better view it with  X-rays). °· Colonoscopy (looking in intestine with a lighted tube). °· Laparoscopy (minor surgery, looking in abdomen with a lighted tube). °· Major abdominal exploratory surgery (looking in abdomen with a large incision). °TREATMENT  °The treatment will depend on the cause of the pain.  °· Many cases can be observed and treated at home. °· Over-the-counter medicines recommended by your caregiver. °· Prescription medicine. °· Antibiotics, for infection. °· Birth control pills, for painful periods or for ovulation pain. °· Hormone treatment, for endometriosis. °· Nerve blocking injections. °· Physical therapy. °· Antidepressants. °· Counseling with a psychologist or psychiatrist. °· Minor or major surgery. °HOME CARE INSTRUCTIONS  °· Do not take laxatives, unless directed by your caregiver. °· Take over-the-counter pain medicine only if ordered by your caregiver. Do not take aspirin because it can cause an upset stomach or bleeding. °· Try a clear liquid diet (broth or water) as ordered by your caregiver. Slowly move to a bland diet, as tolerated, if the pain is related to the stomach or intestine. °· Have a thermometer and take your temperature several times a day, and record it. °· Bed rest and sleep, if it helps the pain. °· Avoid sexual intercourse, if it causes pain. °· Avoid stressful situations. °· Keep your follow-up appointments and tests, as your caregiver orders. °· If the pain does not go away with medicine or surgery, you may try: °¨ Acupuncture. °¨ Relaxation exercises (yoga, meditation). °¨ Group therapy. °¨ Counseling. °SEEK MEDICAL CARE IF:  °· You notice certain foods cause stomach pain. °· Your home care treatment is not helping your pain. °· You need stronger pain medicine. °· You want your IUD removed. °· You feel faint or   lightheaded. °· You develop nausea and vomiting. °· You develop a rash. °· You are having side effects or an allergy to your medicine. °SEEK IMMEDIATE MEDICAL CARE IF:  °· Your  pain does not go away or gets worse. °· You have a fever. °· Your pain is felt only in portions of the abdomen. The right side could possibly be appendicitis. The left lower portion of the abdomen could be colitis or diverticulitis. °· You are passing blood in your stools (bright red or black tarry stools, with or without vomiting). °· You have blood in your urine. °· You develop chills, with or without a fever. °· You pass out. °MAKE SURE YOU:  °· Understand these instructions. °· Will watch your condition. °· Will get help right away if you are not doing well or get worse. °Document Released: 08/04/2007 Document Revised: 12/30/2011 Document Reviewed: 08/24/2009 °[employer]® Patient Information ©2015 [employer]. This information is not intended to replace advice given to you by your health care provider. Make sure you discuss any questions you have with your health care provider. ° °

## 2014-05-10 NOTE — ED Provider Notes (Signed)
Medical screening examination/treatment/procedure(s) were performed by non-physician practitioner and as supervising physician I was immediately available for consultation/collaboration.   EKG Interpretation None        Cheryal Salas M Shaneque Merkle, MD 05/10/14 0458 

## 2014-09-01 ENCOUNTER — Encounter (HOSPITAL_COMMUNITY): Payer: Self-pay | Admitting: *Deleted

## 2014-09-01 ENCOUNTER — Inpatient Hospital Stay (HOSPITAL_COMMUNITY)
Admission: AD | Admit: 2014-09-01 | Discharge: 2014-09-02 | Disposition: A | Discharge status: Home / Self Care | Payer: Medicaid Other | Source: Ambulatory Visit | Attending: Obstetrics & Gynecology | Admitting: Obstetrics & Gynecology

## 2014-09-01 ENCOUNTER — Inpatient Hospital Stay (HOSPITAL_COMMUNITY): Payer: Medicaid Other

## 2014-09-01 DIAGNOSIS — O9989 Other specified diseases and conditions complicating pregnancy, childbirth and the puerperium: Secondary | ICD-10-CM

## 2014-09-01 DIAGNOSIS — O26899 Other specified pregnancy related conditions, unspecified trimester: Secondary | ICD-10-CM

## 2014-09-01 DIAGNOSIS — R109 Unspecified abdominal pain: Secondary | ICD-10-CM

## 2014-09-01 DIAGNOSIS — Z3201 Encounter for pregnancy test, result positive: Secondary | ICD-10-CM | POA: Insufficient documentation

## 2014-09-01 DIAGNOSIS — R1030 Lower abdominal pain, unspecified: Secondary | ICD-10-CM | POA: Diagnosis present

## 2014-09-01 LAB — CBC WITH DIFFERENTIAL/PLATELET
Basophils Absolute: 0 10*3/uL (ref 0.0–0.1)
Basophils Relative: 0 % (ref 0–1)
EOS PCT: 1 % (ref 0–5)
Eosinophils Absolute: 0.1 10*3/uL (ref 0.0–0.7)
HCT: 36 % (ref 36.0–46.0)
Hemoglobin: 12.4 g/dL (ref 12.0–15.0)
LYMPHS ABS: 2.4 10*3/uL (ref 0.7–4.0)
LYMPHS PCT: 31 % (ref 12–46)
MCH: 29.6 pg (ref 26.0–34.0)
MCHC: 34.4 g/dL (ref 30.0–36.0)
MCV: 85.9 fL (ref 78.0–100.0)
MONO ABS: 0.6 10*3/uL (ref 0.1–1.0)
Monocytes Relative: 8 % (ref 3–12)
Neutro Abs: 4.7 10*3/uL (ref 1.7–7.7)
Neutrophils Relative %: 60 % (ref 43–77)
Platelets: 229 10*3/uL (ref 150–400)
RBC: 4.19 MIL/uL (ref 3.87–5.11)
RDW: 13.1 % (ref 11.5–15.5)
WBC: 7.8 10*3/uL (ref 4.0–10.5)

## 2014-09-01 LAB — URINALYSIS, ROUTINE W REFLEX MICROSCOPIC
Bilirubin Urine: NEGATIVE
GLUCOSE, UA: NEGATIVE mg/dL
HGB URINE DIPSTICK: NEGATIVE
Ketones, ur: NEGATIVE mg/dL
Leukocytes, UA: NEGATIVE
Nitrite: NEGATIVE
Protein, ur: NEGATIVE mg/dL
Urobilinogen, UA: 1 mg/dL (ref 0.0–1.0)
pH: 6 (ref 5.0–8.0)

## 2014-09-01 LAB — OB RESULTS CONSOLE ABO/RH: RH Type: POSITIVE

## 2014-09-01 LAB — POCT PREGNANCY, URINE: Preg Test, Ur: POSITIVE — AB

## 2014-09-01 LAB — WET PREP, GENITAL
TRICH WET PREP: NONE SEEN
WBC, Wet Prep HPF POC: NONE SEEN
Yeast Wet Prep HPF POC: NONE SEEN

## 2014-09-01 LAB — HCG, QUANTITATIVE, PREGNANCY: hCG, Beta Chain, Quant, S: 3895 m[IU]/mL — ABNORMAL HIGH (ref <5)

## 2014-09-01 NOTE — MAU Note (Signed)
Pt reports lower abd pain but states she wants to get a pregnancy test. LMP 07/21/2014

## 2014-09-02 LAB — GC/CHLAMYDIA PROBE AMP
CT PROBE, AMP APTIMA: NEGATIVE
GC Probe RNA: NEGATIVE

## 2014-09-02 LAB — ABO/RH: ABO/RH(D): AB POS

## 2014-09-02 LAB — HIV ANTIBODY (ROUTINE TESTING W REFLEX): HIV 1&2 Ab, 4th Generation: NONREACTIVE

## 2014-09-02 NOTE — Discharge Instructions (Signed)
Abdominal Pain During Pregnancy °Belly (abdominal) pain is common during pregnancy. Most of the time, it is not a serious problem. Other times, it can be a sign that something is wrong with the pregnancy. Always tell your doctor if you have belly pain. °HOME CARE °Monitor your belly pain for any changes. The following actions may help you feel better: °· Do not have sex (intercourse) or put anything in your vagina until you feel better. °· Rest until your pain stops. °· Drink clear fluids if you feel sick to your stomach (nauseous). Do not eat solid food until you feel better. °· Only take medicine as told by your doctor. °· Keep all doctor visits as told. °GET HELP RIGHT AWAY IF:  °· You are bleeding, leaking fluid, or pieces of tissue come out of your vagina. °· You have more pain or cramping. °· You keep throwing up (vomiting). °· You have pain when you pee (urinate) or have blood in your pee. °· You have a fever. °· You do not feel your baby moving as much. °· You feel very weak or feel like passing out. °· You have trouble breathing, with or without belly pain. °· You have a very bad headache and belly pain. °· You have fluid leaking from your vagina and belly pain. °· You keep having watery poop (diarrhea). °· Your belly pain does not go away after resting, or the pain gets worse. °MAKE SURE YOU:  °· Understand these instructions. °· Will watch your condition. °· Will get help right away if you are not doing well or get worse. °Document Released: 09/25/2009 Document Revised: 06/09/2013 Document Reviewed: 05/06/2013 °[employer]® Patient Information ©2015 [employer]. This information is not intended to replace advice given to you by your health care provider. Make sure you discuss any questions you have with your health care provider. ° °

## 2014-10-21 ENCOUNTER — Inpatient Hospital Stay (HOSPITAL_COMMUNITY)
Admission: AD | Admit: 2014-10-21 | Discharge: 2014-10-21 | Disposition: A | Discharge status: Home / Self Care | Payer: Medicaid Other | Source: Ambulatory Visit | Attending: Obstetrics and Gynecology | Admitting: Obstetrics and Gynecology

## 2014-10-21 ENCOUNTER — Encounter (HOSPITAL_COMMUNITY): Payer: Self-pay

## 2014-10-21 DIAGNOSIS — O9989 Other specified diseases and conditions complicating pregnancy, childbirth and the puerperium: Secondary | ICD-10-CM | POA: Insufficient documentation

## 2014-10-21 DIAGNOSIS — O26899 Other specified pregnancy related conditions, unspecified trimester: Secondary | ICD-10-CM

## 2014-10-21 DIAGNOSIS — O219 Vomiting of pregnancy, unspecified: Secondary | ICD-10-CM

## 2014-10-21 DIAGNOSIS — R109 Unspecified abdominal pain: Secondary | ICD-10-CM | POA: Insufficient documentation

## 2014-10-21 DIAGNOSIS — N949 Unspecified condition associated with female genital organs and menstrual cycle: Secondary | ICD-10-CM | POA: Diagnosis not present

## 2014-10-21 DIAGNOSIS — O210 Mild hyperemesis gravidarum: Secondary | ICD-10-CM | POA: Diagnosis not present

## 2014-10-21 DIAGNOSIS — Z3A12 12 weeks gestation of pregnancy: Secondary | ICD-10-CM | POA: Insufficient documentation

## 2014-10-21 DIAGNOSIS — R102 Pelvic and perineal pain: Secondary | ICD-10-CM

## 2014-10-21 LAB — CBC
HEMATOCRIT: 37.2 % (ref 36.0–46.0)
Hemoglobin: 12.8 g/dL (ref 12.0–15.0)
MCH: 29.4 pg (ref 26.0–34.0)
MCHC: 34.4 g/dL (ref 30.0–36.0)
MCV: 85.5 fL (ref 78.0–100.0)
Platelets: 200 10*3/uL (ref 150–400)
RBC: 4.35 MIL/uL (ref 3.87–5.11)
RDW: 13.1 % (ref 11.5–15.5)
WBC: 6.9 10*3/uL (ref 4.0–10.5)

## 2014-10-21 LAB — URINALYSIS, ROUTINE W REFLEX MICROSCOPIC
Bilirubin Urine: NEGATIVE
Glucose, UA: NEGATIVE mg/dL
HGB URINE DIPSTICK: NEGATIVE
Ketones, ur: NEGATIVE mg/dL
Leukocytes, UA: NEGATIVE
Nitrite: NEGATIVE
Protein, ur: NEGATIVE mg/dL
SPECIFIC GRAVITY, URINE: 1.02 (ref 1.005–1.030)
Urobilinogen, UA: 1 mg/dL (ref 0.0–1.0)
pH: 7.5 (ref 5.0–8.0)

## 2014-10-21 MED ORDER — PROMETHAZINE HCL 25 MG PO TABS
12.5000 mg | ORAL_TABLET | Freq: Four times a day (QID) | ORAL | Status: DC | PRN
Start: 2014-10-21 — End: 2015-03-29

## 2014-10-21 NOTE — MAU Note (Signed)
Patient states she has been having abdominal pain for about 3 weeks. States she has nausea and vomiting all the time. Was eating when called to triage. Denies bleeding but has a heavy vaginal discharge.

## 2014-10-21 NOTE — Discharge Instructions (Signed)
Nausea medication to take during pregnancy:   Unisom (doxylamine succinate 25 mg tablets) Take one tablet daily at bedtime. If symptoms are not adequately controlled, the dose can be increased to a maximum recommended dose of two tablets daily (1/2 tablet in the morning, 1/2 tablet mid-afternoon and one at bedtime).  Vitamin B6  tablets. Take one tablet twice a day (up to 200 mg per day).  Add Phenergan as prescribed to take as needed.     Round Ligament Pain During Pregnancy Round ligament pain is a sharp pain or jabbing feeling often felt in the lower belly or groin area on one or both sides. It is one of the most common complaints during pregnancy and is considered a normal part of pregnancy. It is most often felt during the second trimester.  Here is what you need to know about round ligament pain, including some tips to help you feel better.  Causes of Round Ligament Pain  Several thick ligaments surround and support your womb (uterus) as it grows during pregnancy. One of them is called the round ligament.  The round ligament connects the front part of the womb to your groin, the area where your legs attach to your pelvis. The round ligament normally tightens and relaxes slowly.  As your baby and womb grow, the round ligament stretches. That makes it more likely to become strained.  Sudden movements can cause the ligament to tighten quickly, like a rubber band snapping. This causes a sudden and quick jabbing feeling.  Symptoms of Round Ligament Pain  Round ligament pain can be concerning and uncomfortable. But it is considered normal as your body changes during pregnancy.  The symptoms of round ligament pain include a sharp, sudden spasm in the belly. It usually affects the right side, but it may happen on both sides. The pain only lasts a few seconds.  Exercise may cause the pain, as will rapid movements such as:  sneezing coughing laughing rolling over in bed standing  up too quickly  Treatment of Round Ligament Pain  Here are some tips that may help reduce your discomfort:  Pain relief. Take over-the-counter acetaminophen for pain, if necessary. Ask your doctor if this is OK.  Exercise. Get plenty of exercise to keep your stomach (core) muscles strong. Doing stretching exercises or prenatal yoga can be helpful. Ask your doctor which exercises are safe for you and your baby.  A helpful exercise involves putting your hands and knees on the floor, lowering your head, and pushing your backside into the air.  Avoid sudden movements. Change positions slowly (such as standing up or sitting down) to avoid sudden movements that may cause stretching and pain.  Flex your hips. Bend and flex your hips before you cough, sneeze, or laugh to avoid pulling on the ligaments.  Apply warmth. A heating pad or warm bath may be helpful. Ask your doctor if this is OK. Extreme heat can be dangerous to the baby.  You should try to modify your daily activity level and avoid positions that may worsen the condition.  When to Call the Doctor/Midwife  Always tell your doctor or midwife about any type of pain you have during pregnancy. Round ligament pain is quick and doesn't last long.  Call your health care provider immediately if you have:  severe pain fever chills pain on urination difficulty walking  Belly pain during pregnancy can be due to many different causes. It is important for your doctor to rule out more  serious conditions, including pregnancy complications such as placenta abruption or non-pregnancy illnesses such as:  inguinal hernia appendicitis stomach, liver, and kidney problems Preterm labor pains may sometimes be mistaken for round ligament pain.  Morning Sickness Morning sickness is when you feel sick to your stomach (nauseous) during pregnancy. This nauseous feeling may or may not come with vomiting. It often occurs in the morning but can be a  problem any time of day. Morning sickness is most common during the first trimester, but it may continue throughout pregnancy. While morning sickness is unpleasant, it is usually harmless unless you develop severe and continual vomiting (hyperemesis gravidarum). This condition requires more intense treatment.  CAUSES  The cause of morning sickness is not completely known but seems to be related to normal hormonal changes that occur in pregnancy. RISK FACTORS You are at greater risk if you:  Experienced nausea or vomiting before your pregnancy.  Had morning sickness during a previous pregnancy.  Are pregnant with more than one baby, such as twins. TREATMENT  Do not use any medicines (prescription, over-the-counter, or herbal) for morning sickness without first talking to your health care provider. Your health care provider may prescribe or recommend:  Vitamin B6 supplements.  Anti-nausea medicines.  The herbal medicine ginger. HOME CARE INSTRUCTIONS   Only take over-the-counter or prescription medicines as directed by your health care provider.  Taking multivitamins before getting pregnant can prevent or decrease the severity of morning sickness in most women.  Eat a piece of dry toast or unsalted crackers before getting out of bed in the morning.  Eat five or six small meals a day.  Eat dry and bland foods (rice, baked potato). Foods high in carbohydrates are often helpful.  Do not drink liquids with your meals. Drink liquids between meals.  Avoid greasy, fatty, and spicy foods.  Get someone to cook for you if the smell of any food causes nausea and vomiting.  If you feel nauseous after taking prenatal vitamins, take the vitamins at night or with a snack.  Snack on protein foods (nuts, yogurt, cheese) between meals if you are hungry.  Eat unsweetened gelatins for desserts.  Wearing an acupressure wristband (worn for sea sickness) may be helpful.  Acupuncture may be  helpful.  Do not smoke.  Get a humidifier to keep the air in your house free of odors.  Get plenty of fresh air. SEEK MEDICAL CARE IF:   Your home remedies are not working, and you need medicine.  You feel dizzy or lightheaded.  You are losing weight. SEEK IMMEDIATE MEDICAL CARE IF:   You have persistent and uncontrolled nausea and vomiting.  You pass out (faint). MAKE SURE YOU:  Understand these instructions.  Will watch your condition.  Will get help right away if you are not doing well or get worse. Document Released: 11/28/2006 Document Revised: 10/12/2013 Document Reviewed: 03/24/2013 [facility] Patient Information 2015 [place], [place]. This information is not intended to replace advice given to you by your health care provider. Make sure you discuss any questions you have with your health care provider.

## 2014-10-21 NOTE — L&D Delivery Note (Signed)
Delivery Note At 12:58 PM a viable and healthy female was delivered via Vaginal, Spontaneous Delivery (Presentation: ; Occiput Anterior).  APGAR: 9, 9; weight P .   Placenta status: Intact, Spontaneous.  Cord: 3 vessels with the following complications: None.    Anesthesia: Epidural  Episiotomy: None Lacerations: 1st degree Suture Repair: 3.0 vicryl rapide Est. Blood Loss (mL): 97  Mom to postpartum.  Baby to Couplet care / Skin to Skin.  Bovard-Stuckert, Scherrie [place] 05/01/2015, 1:25 PM  AB+/Br/RI/Tdap in PNC/Contra?

## 2014-10-21 NOTE — MAU Note (Signed)
Pt reports abdominal pain for 3-4 weeks that feels like a menstrual cramp. Pt reports constant nausea and usually vomits 1-2 times a day. Pt denies vomiting today.

## 2014-10-21 NOTE — MAU Provider Note (Signed)
Chief Complaint: Abdominal Pain; Nausea; and Vaginal Discharge   First Provider Initiated Contact with Patient 10/21/14 1221     SUBJECTIVE HPI: Tammy Welch is a 24 y.o. G1P0 at [redacted]w[redacted]d by LMP who presents to maternity admissions reporting abdominal pain described as cramping in bilateral inguinal areas, mostly on right side.  She reports she has not started prenatal care yet because she is waiting on Medicaid but plans to go to [facility].  She does report thin white vaginal discharge but denies itching/burning/odor and thinks this is probably normal related to the pregnancy.  She does not desire a pelvic exam if she is going to get one as part of her prenatal visit.  She denies vaginal bleeding, vaginal itching/burning, urinary symptoms, h/a, dizziness, n/v, or fever/chills.     Past Medical History  Diagnosis Date  . Boil of buttock    History reviewed. No pertinent past surgical history. History   Social History  . Marital Status: Single    Spouse Name: N/A    Number of Children: N/A  . Years of Education: N/A   Occupational History  . Not on file.   Social History Main Topics  . Smoking status: Never Smoker   . Smokeless tobacco: Not on file  . Alcohol Use: No     Comment: liquor and beer last week  . Drug Use: No  . Sexual Activity: Not on file   Other Topics Concern  . Not on file   Social History Narrative   No current facility-administered medications on file prior to encounter.   No current outpatient prescriptions on file prior to encounter.   No Known Allergies  ROS: Pertinent items in HPI  OBJECTIVE Blood pressure 116/62, pulse 90, temperature 99.2 F (37.3 C), temperature source Oral, height  (1.6 m), weight 82.827 kg (182 lb 9.6 oz), last menstrual period 07/21/2014, SpO2 100 %. GENERAL: Well-developed, well-nourished female in no acute distress.  HEENT: Normocephalic HEART: normal rate RESP: normal effort ABDOMEN: Soft,  non-tender, no rebound tenderness, no guarding EXTREMITIES: Nontender, no edema NEURO: Alert and oriented SPECULUM EXAM: Deferred  LAB RESULTS Results for orders placed or performed during the hospital encounter of 10/21/14 (from the past 24 hour(s))  Urinalysis, Routine w reflex microscopic     Status: Abnormal   Collection Time: 10/21/14 11:30 AM  Result Value Ref Range   Color, Urine YELLOW YELLOW   APPearance CLOUDY (A) CLEAR   Specific Gravity, Urine 1.020 1.005 - 1.030   pH 7.5 5.0 - 8.0   Glucose, UA NEGATIVE NEGATIVE mg/dL   Hgb urine dipstick NEGATIVE NEGATIVE   Bilirubin Urine NEGATIVE NEGATIVE   Ketones, ur NEGATIVE NEGATIVE mg/dL   Protein, ur NEGATIVE NEGATIVE mg/dL   Urobilinogen, UA 1.0 0.0 - 1.0 mg/dL   Nitrite NEGATIVE NEGATIVE   Leukocytes, UA NEGATIVE NEGATIVE  CBC     Status: None   Collection Time: 10/21/14 12:37 PM  Result Value Ref Range   WBC 6.9 4.0 - 10.5 K/uL   RBC 4.35 3.87 - 5.11 MIL/uL   Hemoglobin 12.8 12.0 - 15.0 g/dL   HCT 09.8 11.9 - 14.7 %   MCV 85.5 78.0 - 100.0 fL   MCH 29.4 26.0 - 34.0 pg   MCHC 34.4 30.0 - 36.0 g/dL   RDW 82.9 56.2 - 13.0 %   Platelets 200 150 - 400 K/uL    IMAGING No results found.  ASSESSMENT 1. Pain of round ligament affecting pregnancy, antepartum  2. Nausea and vomiting during pregnancy prior to [redacted] weeks gestation     PLAN Discharge home Phenergan Rx renewed Discussed use of Unisom/B6 for nausea Rest/ice/heat/Tylenol for round ligament pain   Follow-up Information    Follow up with CALLAHAN, SIDNEY, DO.   Specialty:  Obstetrics and Gynecology   Why:  Or scheduled provider at [facility] information:   [address] [place] [address] [phone]       Follow up with [facility] ADMISS[facility].   Why:  As needed for emergencies   Contact information:   [address] [id number] mc [place]  [address] [phone]      Sharen Counter Certified Nurse-Midwife 10/21/2014  1:33 PM

## 2014-11-09 LAB — OB RESULTS CONSOLE ABO/RH: RH TYPE: POSITIVE

## 2014-11-09 LAB — OB RESULTS CONSOLE RPR
RPR: NONREACTIVE
RPR: NONREACTIVE

## 2014-11-09 LAB — OB RESULTS CONSOLE GC/CHLAMYDIA
Chlamydia: NEGATIVE
Gonorrhea: NEGATIVE

## 2014-11-09 LAB — OB RESULTS CONSOLE HEPATITIS B SURFACE ANTIGEN: Hepatitis B Surface Ag: NEGATIVE

## 2014-11-09 LAB — OB RESULTS CONSOLE HIV ANTIBODY (ROUTINE TESTING): HIV: NONREACTIVE

## 2014-11-09 LAB — OB RESULTS CONSOLE RUBELLA ANTIBODY, IGM: RUBELLA: IMMUNE

## 2014-12-23 ENCOUNTER — Emergency Department (HOSPITAL_COMMUNITY)
Admission: EM | Admit: 2014-12-23 | Discharge: 2014-12-23 | Disposition: A | Discharge status: Home / Self Care | Payer: Medicaid Other | Attending: Emergency Medicine | Admitting: Emergency Medicine

## 2014-12-23 ENCOUNTER — Encounter (HOSPITAL_COMMUNITY): Payer: Self-pay | Admitting: Emergency Medicine

## 2014-12-23 DIAGNOSIS — B349 Viral infection, unspecified: Secondary | ICD-10-CM | POA: Insufficient documentation

## 2014-12-23 DIAGNOSIS — Z79899 Other long term (current) drug therapy: Secondary | ICD-10-CM | POA: Insufficient documentation

## 2014-12-23 DIAGNOSIS — Z3202 Encounter for pregnancy test, result negative: Secondary | ICD-10-CM | POA: Insufficient documentation

## 2014-12-23 DIAGNOSIS — J069 Acute upper respiratory infection, unspecified: Secondary | ICD-10-CM | POA: Diagnosis not present

## 2014-12-23 DIAGNOSIS — J029 Acute pharyngitis, unspecified: Secondary | ICD-10-CM | POA: Diagnosis present

## 2014-12-23 DIAGNOSIS — Z872 Personal history of diseases of the skin and subcutaneous tissue: Secondary | ICD-10-CM | POA: Diagnosis not present

## 2014-12-23 LAB — COMPREHENSIVE METABOLIC PANEL
ALBUMIN: 3.4 g/dL — AB (ref 3.5–5.2)
ALT: 27 U/L (ref 0–35)
AST: 26 U/L (ref 0–37)
Alkaline Phosphatase: 40 U/L (ref 39–117)
Anion gap: 6 (ref 5–15)
BUN: 9 mg/dL (ref 6–23)
CHLORIDE: 104 mmol/L (ref 96–112)
CO2: 24 mmol/L (ref 19–32)
Calcium: 8.6 mg/dL (ref 8.4–10.5)
Creatinine, Ser: 0.56 mg/dL (ref 0.50–1.10)
GFR calc Af Amer: >90 mL/min (ref >90)
Glucose, Bld: 84 mg/dL (ref 70–99)
POTASSIUM: 3.3 mmol/L — AB (ref 3.5–5.1)
Sodium: 134 mmol/L — ABNORMAL LOW (ref 135–145)
Total Bilirubin: 0.3 mg/dL (ref 0.3–1.2)
Total Protein: 6.4 g/dL (ref 6.0–8.3)

## 2014-12-23 LAB — CBC WITH DIFFERENTIAL/PLATELET
Basophils Absolute: 0 10*3/uL (ref 0.0–0.1)
Basophils Relative: 0 % (ref 0–1)
Eosinophils Absolute: 0.1 10*3/uL (ref 0.0–0.7)
Eosinophils Relative: 1 % (ref 0–5)
HEMATOCRIT: 35.4 % — AB (ref 36.0–46.0)
Hemoglobin: 12.1 g/dL (ref 12.0–15.0)
LYMPHS PCT: 22 % (ref 12–46)
Lymphs Abs: 0.9 10*3/uL (ref 0.7–4.0)
MCH: 30 pg (ref 26.0–34.0)
MCHC: 34.2 g/dL (ref 30.0–36.0)
MCV: 87.6 fL (ref 78.0–100.0)
Monocytes Absolute: 0.5 10*3/uL (ref 0.1–1.0)
Monocytes Relative: 11 % (ref 3–12)
NEUTROS ABS: 2.8 10*3/uL (ref 1.7–7.7)
Neutrophils Relative %: 66 % (ref 43–77)
PLATELETS: 168 10*3/uL (ref 150–400)
RBC: 4.04 MIL/uL (ref 3.87–5.11)
RDW: 13.7 % (ref 11.5–15.5)
WBC: 4.3 10*3/uL (ref 4.0–10.5)

## 2014-12-23 LAB — POC URINE PREG, ED: Preg Test, Ur: POSITIVE — AB

## 2014-12-23 MED ORDER — PSEUDOEPH-BROMPHEN-DM 30-2-10 MG/5ML PO SYRP: 1.2500 mL | ORAL_SOLUTION | Freq: Four times a day (QID) | ORAL | Status: DC | PRN

## 2014-12-23 MED ORDER — ACETAMINOPHEN 500 MG PO TABS: 500.0000 mg | ORAL_TABLET | Freq: Four times a day (QID) | ORAL | Status: DC | PRN

## 2014-12-23 NOTE — ED Notes (Addendum)
Pt reports sore throat along with body aches x5 days. Pt denies n/v/d. Pt is 5 months pregnant.

## 2014-12-23 NOTE — Discharge Instructions (Signed)
Cough, Adult  A cough is a reflex that helps clear your throat and airways. It can help heal the body or may be a reaction to an irritated airway. A cough may only last 2 or 3 weeks (acute) or may last more than 8 weeks (chronic).  CAUSES Acute cough:  Viral or bacterial infections. Chronic cough:  Infections.  Allergies.  Asthma.  Post-nasal drip.  Smoking.  Heartburn or acid reflux.  Some medicines.  Chronic lung problems (COPD).  Cancer. SYMPTOMS   Cough.  Fever.  Chest pain.  Increased breathing rate.  High-pitched whistling sound when breathing (wheezing).  Colored mucus that you cough up (sputum). TREATMENT   A bacterial cough may be treated with antibiotic medicine.  A viral cough must run its course and will not respond to antibiotics.  Your caregiver may recommend other treatments if you have a chronic cough. HOME CARE INSTRUCTIONS   Only take over-the-counter or prescription medicines for pain, discomfort, or fever as directed by your caregiver. Use cough suppressants only as directed by your caregiver.  Use a cold steam vaporizer or humidifier in your bedroom or home to help loosen secretions.  Sleep in a semi-upright position if your cough is worse at night.  Rest as needed.  Stop smoking if you smoke. SEEK IMMEDIATE MEDICAL CARE IF:   You have pus in your sputum.  Your cough starts to worsen.  You cannot control your cough with suppressants and are losing sleep.  You begin coughing up blood.  You have difficulty breathing.  You develop pain which is getting worse or is uncontrolled with medicine.  You have a fever. MAKE SURE YOU:   Understand these instructions.  Will watch your condition.  Will get help right away if you are not doing well or get worse. Document Released: 04/05/2011 Document Revised: 12/30/2011 Document Reviewed: 04/05/2011 [employer] Patient Information 2015 [employer]. This information is not intended  to replace advice given to you by your health care provider. Make sure you discuss any questions you have with your health care provider.  

## 2014-12-23 NOTE — ED Provider Notes (Signed)
CSN: [id number]     Arrival date & time 12/23/14  1933 History   First MD Initiated Contact with Patient 12/23/14 2222     Chief Complaint  Patient presents with  . Generalized Body Aches  . Sore Throat     (Consider location/radiation/quality/duration/timing/severity/associated sxs/prior Treatment) HPI Tammy Welch is a 24 y.o. female who is currently 5 months pregnant who comes in for evaluation of URI-like symptoms. Patient states for the past 3 or 4 days she has had a sore throat, productive cough, body aches. She reports she has not taken anything for her discomfort "because I did not know what I can take". Discomfort is 6/10. [facility]. She denies any associated fevers, headache, chest pain, shortness of breath, nausea or vomiting, diarrhea or constipation, abdominal pain, vaginal bleeding or discharge, urinary symptoms, weakness or syncope, rash.  Past Medical History  Diagnosis Date  . Boil of buttock    History reviewed. No pertinent past surgical history. Family History  Problem Relation Age of Onset  . Hypertension Other    History  Substance Use Topics  . Smoking status: Never Smoker   . Smokeless tobacco: Not on file  . Alcohol Use: No     Comment: liquor and beer last week   OB History    Gravida Para Term Preterm AB TAB SAB Ectopic Multiple Living   1              Review of Systems A 10 point review of systems was completed and was negative except for pertinent positives and negatives as mentioned in the history of present illness     Allergies  Review of patient's allergies indicates no known allergies.  Home Medications   Prior to Admission medications   Medication Sig Start Date End Date Taking? Authorizing Provider  acetaminophen (TYLENOL) 500 MG tablet Take 1 tablet (500 mg total) by mouth every 6 (six) hours as needed. 12/23/14   Sharlene MottsBenjamin W Shayan Bramhall, PA-C  brompheniramine-pseudoephedrine-DM  30-2-10 MG/5ML syrup Take 1.3 mLs by mouth 4 (four) times daily as needed. 12/23/14   Sharlene MottsBenjamin W Taleya Whitcher, PA-C  Prenatal Vit-Min-FA-Fish Oil ([facility] PRENATAL GUMMY PO) Take 2 tablets by mouth daily.    Historical Provider, MD  promethazine (PHENERGAN) 25 MG tablet Take 0.5-1 tablets (12.5-25 mg total) by mouth every 6 (six) hours as needed for nausea or vomiting. 10/21/14   Misty StanleyLisa A Leftwich-Kirby, CNM   BP 121/68 mmHg  Pulse 93  Temp(Src) 98.3 F (36.8 C) (Oral)  Resp 15  SpO2 100%  LMP 07/21/2014 Physical Exam  Constitutional: She is oriented to person, place, and time. She appears well-developed and well-nourished.  HENT:  Head: Normocephalic and atraumatic.  Mouth/Throat: Oropharynx is clear and moist.  No tonsillar exudate or tonsillar swelling. Posterior oropharynx appears normal, is clear and moist  Eyes: Conjunctivae are normal. Pupils are equal, round, and reactive to light. Right eye exhibits no discharge. Left eye exhibits no discharge. No scleral icterus.  Neck: Neck supple.  Cardiovascular: Normal rate, regular rhythm and normal heart sounds.   Pulmonary/Chest: Effort normal and breath sounds normal. No respiratory distress. She has no wheezes. She has no rales.  Abdominal: Soft. There is no tenderness.  Musculoskeletal: She exhibits no tenderness.  Lymphadenopathy:    She has no cervical adenopathy.  Neurological: She is alert and oriented to person, place, and time.  Cranial Nerves II-XII grossly intact  Skin: Skin is warm and dry. No  rash noted.  Psychiatric: She has a normal mood and affect.  Nursing note and vitals reviewed.   ED Course  Procedures (including critical care time) Labs Review Labs Reviewed  CBC WITH DIFFERENTIAL/PLATELET - Abnormal; Notable for the following:    HCT 35.4 (*)    All other components within normal limits  COMPREHENSIVE METABOLIC PANEL - Abnormal; Notable for the following:    Sodium 134 (*)    Potassium 3.3 (*)    Albumin 3.4 (*)     All other components within normal limits  POC URINE PREG, ED - Abnormal; Notable for the following:    Preg Test, Ur POSITIVE (*)    All other components within normal limits    Imaging Review No results found.   EKG Interpretation None     Meds given in ED:  Medications - No data to display  New Prescriptions   ACETAMINOPHEN (TYLENOL) 500 MG TABLET    Take 1 tablet (500 mg total) by mouth every 6 (six) hours as needed.   BROMPHENIRAMINE-PSEUDOEPHEDRINE-DM 30-2-10 MG/5ML SYRUP    Take 1.3 mLs by mouth 4 (four) times daily as needed.   Filed Vitals:   12/23/14 1944 12/23/14 2255  BP: 121/68 99/61  Pulse: 93 80  Temp: 98.3 F (36.8 C) 98.8 F (37.1 C)  TempSrc: Oral Oral  Resp: 15 12  SpO2: 100% 100%    MDM  Vitals stable - WNL -afebrile, O2 sats 100% RA Pt resting comfortably in ED. PE-benign lung exam, otherwise unremarkable.  Labwork noncontributory  I do not feel imaging is necessary at this time with stable vitals and benign lung exam.  DDX--Negative Centor criteria, doubt streptococcal infection. Benign lung exam, afebrile, low concern for pneumonia or other acute or emergent pulmonary pathology. Low concern for PE. Will DC with Robitussin-DM, Tylenol. Encourage fluid rehydration. I discussed all relevant lab findings and imaging results with pt and they verbalized understanding. Discussed f/u with PCP within 48 hrs and return precautions, pt very amenable to plan.  Prior to patient discharge, I discussed and reviewed this case with Dr.Docherty     Final diagnoses:  URI (upper respiratory infection)  Viral syndrome        Sharlene Motts, PA-C 12/24/14 1102  Toy Cookey, MD 12/24/14 2107

## 2015-03-29 ENCOUNTER — Encounter (HOSPITAL_COMMUNITY): Payer: Self-pay | Admitting: *Deleted

## 2015-03-29 ENCOUNTER — Inpatient Hospital Stay (HOSPITAL_COMMUNITY)
Admission: AD | Admit: 2015-03-29 | Discharge: 2015-03-29 | Disposition: A | Discharge status: Home / Self Care | Payer: Medicaid Other | Source: Ambulatory Visit | Attending: Obstetrics and Gynecology | Admitting: Obstetrics and Gynecology

## 2015-03-29 DIAGNOSIS — I959 Hypotension, unspecified: Secondary | ICD-10-CM | POA: Insufficient documentation

## 2015-03-29 DIAGNOSIS — O9989 Other specified diseases and conditions complicating pregnancy, childbirth and the puerperium: Secondary | ICD-10-CM | POA: Diagnosis not present

## 2015-03-29 DIAGNOSIS — Z3A34 34 weeks gestation of pregnancy: Secondary | ICD-10-CM | POA: Insufficient documentation

## 2015-03-29 DIAGNOSIS — R42 Dizziness and giddiness: Secondary | ICD-10-CM | POA: Insufficient documentation

## 2015-03-29 DIAGNOSIS — O2653 Maternal hypotension syndrome, third trimester: Secondary | ICD-10-CM

## 2015-03-29 LAB — GLUCOSE, CAPILLARY: Glucose-Capillary: 79 mg/dL (ref 65–99)

## 2015-03-29 LAB — CBC WITH DIFFERENTIAL/PLATELET
Basophils Absolute: 0 10*3/uL (ref 0.0–0.1)
Basophils Relative: 1 % (ref 0–1)
EOS PCT: 1 % (ref 0–5)
Eosinophils Absolute: 0.1 10*3/uL (ref 0.0–0.7)
HCT: 32.5 % — ABNORMAL LOW (ref 36.0–46.0)
Hemoglobin: 11.6 g/dL — ABNORMAL LOW (ref 12.0–15.0)
LYMPHS PCT: 26 % (ref 12–46)
Lymphs Abs: 2.1 10*3/uL (ref 0.7–4.0)
MCH: 30.9 pg (ref 26.0–34.0)
MCHC: 35.7 g/dL (ref 30.0–36.0)
MCV: 86.7 fL (ref 78.0–100.0)
MONO ABS: 0.9 10*3/uL (ref 0.1–1.0)
Monocytes Relative: 11 % (ref 3–12)
NEUTROS ABS: 5 10*3/uL (ref 1.7–7.7)
NEUTROS PCT: 61 % (ref 43–77)
PLATELETS: 102 10*3/uL — AB (ref 150–400)
RBC: 3.75 MIL/uL — AB (ref 3.87–5.11)
RDW: 13.4 % (ref 11.5–15.5)
WBC: 8.1 10*3/uL (ref 4.0–10.5)

## 2015-03-29 LAB — URINE MICROSCOPIC-ADD ON

## 2015-03-29 LAB — URINALYSIS, ROUTINE W REFLEX MICROSCOPIC
Bilirubin Urine: NEGATIVE
GLUCOSE, UA: NEGATIVE mg/dL
HGB URINE DIPSTICK: NEGATIVE
Ketones, ur: NEGATIVE mg/dL
Nitrite: NEGATIVE
PROTEIN: NEGATIVE mg/dL
Urobilinogen, UA: 0.2 mg/dL (ref 0.0–1.0)
pH: 6 (ref 5.0–8.0)

## 2015-03-29 NOTE — Discharge Instructions (Signed)
Dizziness ° Dizziness means you feel unsteady or lightheaded. You might feel like you are going to pass out (faint). °HOME CARE  °· Drink enough fluids to keep your pee (urine) clear or pale yellow. °· Take your medicines exactly as told by your doctor. If you take blood pressure medicine, always stand up slowly from the lying or sitting position. Hold on to something to steady yourself. °· If you need to stand in one place for a long time, move your legs often. Tighten and relax your leg muscles. °· Have someone stay with you until you feel okay. °· Do not drive or use heavy machinery if you feel dizzy. °· Do not drink alcohol. °GET HELP RIGHT AWAY IF:  °· You feel dizzy or lightheaded and it gets worse. °· You feel sick to your stomach (nauseous), or you throw up (vomit). °· You have trouble talking or walking. °· You feel weak or have trouble using your arms, hands, or legs. °· You cannot think clearly or have trouble forming sentences. °· You have chest pain, belly (abdominal) pain, sweating, or you are short of breath. °· Your vision changes. °· You are bleeding. °· You have problems from your medicine that seem to be getting worse. °MAKE SURE YOU:  °· Understand these instructions. °· Will watch your condition. °· Will get help right away if you are not doing well or get worse. °Document Released: 09/26/2011 Document Revised: 12/30/2011 Document Reviewed: 09/26/2011 °Exit[employer]® Patient Information ©2015 [employer]. This information is not intended to replace advice given to you by your health care provider. Make sure you discuss any questions you have with your health care provider. ° °Hypotension °As your heart beats, it forces blood through your body. This force is called blood pressure. If you have hypotension, you have low blood pressure. When your blood pressure is too low, you may not get enough blood to your brain. You may feel weak, feel lightheaded, have a fast heartbeat, or even pass out  (faint). °HOME CARE °· Drink enough fluids to keep your pee (urine) clear or pale yellow. °· Take all medicines as told by your doctor. °· Get up slowly after sitting or lying down. °· Wear support stockings as told by your doctor. °· Maintain a healthy diet by including foods such as fruits, vegetables, nuts, whole grains, and lean meats. °GET HELP IF: °· You are throwing up (vomiting) or have watery poop (diarrhea). °· You have a fever for more than 2-3 days. °· You feel more thirsty than usual. °· You feel weak and tired. °GET HELP RIGHT AWAY IF:  °· You pass out (faint). °· You have chest pain or a fast or irregular heartbeat. °· You lose feeling in part of your body. °· You cannot move your arms or legs. °· You have trouble speaking. °· You get sweaty or feel lightheaded. °MAKE SURE YOU:  °· Understand these instructions. °· Will watch your condition. °· Will get help right away if you are not doing well or get worse. °Document Released: 01/01/2010 Document Revised: 06/09/2013 Document Reviewed: 04/09/2013 °[employer]® Patient Information ©2015 [employer]. This information is not intended to replace advice given to you by your health care provider. Make sure you discuss any questions you have with your health care provider. ° °

## 2015-03-29 NOTE — MAU Provider Note (Signed)
History     CSN: [id number]  Arrival date and time: 03/29/15 4098   First Provider Initiated Contact with Patient 03/29/15 [phone]      Chief Complaint  Patient presents with  . Dizziness   HPI  Tammy Welch is a 24 y.o. G1P0 at [redacted]w[redacted]d who presents to MAU today with complaint of dizziness since yesterday morning. The patient states that she had one severe episodes where she had blurred vision and "saw stars" and became diaphoretic. She did not pass out. This resolved spontaneously. Patient states continued to feel dizzy all day and is feeling weak. She denies abdominal pain, vaginal bleeding, LOF, contractions or UTI symptoms. She denies recent N/V/D. She reports good fetal movement and denies complications with the pregnancy.   OB History    Gravida Para Term Preterm AB TAB SAB Ectopic Multiple Living   1               Past Medical History  Diagnosis Date  . Boil of buttock     History reviewed. No pertinent past surgical history.  Family History  Problem Relation Age of Onset  . Hypertension Other     History  Substance Use Topics  . Smoking status: Never Smoker   . Smokeless tobacco: Not on file  . Alcohol Use: No     Comment: liquor and beer last week    Allergies: No Known Allergies  Prescriptions prior to admission  Medication Sig Dispense Refill Last Dose  . Prenatal Vit-Min-FA-Fish Oil ([facility] PRENATAL GUMMY PO) Take 2 tablets by mouth daily.   03/28/2015 at Unknown time  . ranitidine (ZANTAC) 150 MG tablet Take 150 mg by mouth as needed for heartburn.   03/28/2015 at Unknown time  . acetaminophen (TYLENOL) 500 MG tablet Take 1 tablet (500 mg total) by mouth every 6 (six) hours as needed. 30 tablet 0   . brompheniramine-pseudoephedrine-DM 30-2-10 MG/5ML syrup Take 1.3 mLs by mouth 4 (four) times daily as needed. 120 mL 0   . promethazine (PHENERGAN) 25 MG tablet Take 0.5-1 tablets (12.5-25 mg total) by mouth every 6 (six) hours as needed for nausea or  vomiting. 30 tablet 2     Review of Systems  Constitutional: Negative for fever and malaise/fatigue.  Gastrointestinal: Negative for nausea, vomiting, abdominal pain and diarrhea.  Genitourinary: Negative for dysuria, urgency and frequency.       Neg - vaginal bleeding, discharge, LOF  Neurological: Positive for dizziness and weakness. Negative for loss of consciousness.   Physical Exam   Blood pressure 115/57, pulse 105, temperature 98.2 F (36.8 C), temperature source Oral, resp. rate 20, height  (1.575 m), weight 194 lb 8 oz (88.225 kg), last menstrual period 07/21/2014.  Physical Exam  Nursing note and vitals reviewed. Constitutional: She is oriented to person, place, and time. She appears well-developed and well-nourished. No distress.  HENT:  Head: Normocephalic and atraumatic.  Cardiovascular: Normal rate.   Respiratory: Effort normal.  GI: Soft.  Neurological: She is alert and oriented to person, place, and time.  Skin: Skin is warm and dry. No erythema.  Psychiatric: She has a normal mood and affect.   Results for orders placed or performed during the hospital encounter of 03/29/15 (from the past 24 hour(s))  Urinalysis, Routine w reflex microscopic (not at [facility])     Status: Abnormal   Collection Time: 03/29/15  3:56 AM  Result Value Ref Range   Color, Urine YELLOW YELLOW   APPearance CLEAR  CLEAR   Specific Gravity, Urine <1.005 (L) 1.005 - 1.030   pH 6.0 5.0 - 8.0   Glucose, UA NEGATIVE NEGATIVE mg/dL   Hgb urine dipstick NEGATIVE NEGATIVE   Bilirubin Urine NEGATIVE NEGATIVE   Ketones, ur NEGATIVE NEGATIVE mg/dL   Protein, ur NEGATIVE NEGATIVE mg/dL   Urobilinogen, UA 0.2 0.0 - 1.0 mg/dL   Nitrite NEGATIVE NEGATIVE   Leukocytes, UA TRACE (A) NEGATIVE  Urine microscopic-add on     Status: None   Collection Time: 03/29/15  3:56 AM  Result Value Ref Range   Squamous Epithelial / LPF RARE RARE   WBC, UA 0-2 <3 WBC/hpf   RBC / HPF 0-2 <3 RBC/hpf   Bacteria,  UA RARE RARE  CBC with Differential/Platelet     Status: Abnormal   Collection Time: 03/29/15  4:39 AM  Result Value Ref Range   WBC 8.1 4.0 - 10.5 K/uL   RBC 3.75 (L) 3.87 - 5.11 MIL/uL   Hemoglobin 11.6 (L) 12.0 - 15.0 g/dL   HCT 45.432.5 (L) 09.836.0 - 11.946.0 %   MCV 86.7 78.0 - 100.0 fL   MCH 30.9 26.0 - 34.0 pg   MCHC 35.7 30.0 - 36.0 g/dL   RDW 14.713.4 82.911.5 - 56.215.5 %   Platelets 102 (L) 150 - 400 K/uL   Neutrophils Relative % 61 43 - 77 %   Neutro Abs 5.0 1.7 - 7.7 K/uL   Lymphocytes Relative 26 12 - 46 %   Lymphs Abs 2.1 0.7 - 4.0 K/uL   Monocytes Relative 11 3 - 12 %   Monocytes Absolute 0.9 0.1 - 1.0 K/uL   Eosinophils Relative 1 0 - 5 %   Eosinophils Absolute 0.1 0.0 - 0.7 K/uL   Basophils Relative 1 0 - 1 %   Basophils Absolute 0.0 0.0 - 0.1 K/uL  Glucose, capillary     Status: None   Collection Time: 03/29/15  4:53 AM  Result Value Ref Range   Glucose-Capillary 79 65 - 99 mg/dL    Orthostatic VS for the past 24 hrs:  BP- Lying Pulse- Lying BP- Sitting Pulse- Sitting BP- Standing at 0 minutes Pulse- Standing at 0 minutes  03/29/15 0446 - - - - 111/66 mmHg 89  03/29/15 0445 - - 111/67 mmHg 91 - -  03/29/15 0444 93/52 mmHg 92 - - - -    Fetal Monitoring: Baseline: 140 bpm, moderate variability, + accelerations, no decelerations Contractions: one contractions noted with mild UI otherwise  MAU Course  Procedures None  MDM UA, CBC, CBG and orthostatic vital signs today No evidence of orthostatic changes No evidence of dehydration noted on UA, mild anemia on CBC, normal glucose Discussed patient with Dr. Ambrose MantleHenley. He agrees with plan to discharge patient at this time. Encourage increased PO hydration and rest.  Assessment and Plan  A: SIUP at 3941w6d Dizziness Hypotension  P: Discharge home Preterm labor precautions discussed Patient advised to increased PO hydration as tolerated Patient advised to follow-up with Dr. Ellyn HackBovard as scheduled for routine prenatal care or  sooner PRN Patient may return to MAU as needed or if her condition were to change or worsen   Tammy LowensteinJulie N Arlan Birks, PA-C  03/29/2015, 5:22 AM

## 2015-03-29 NOTE — MAU Note (Signed)
PT SAYS SHE BECAME DIZZY  AT 1100-  IN THE MALL SHOPPING-    .  DIZZINESS CONTINUED  ALL DAY   AND  TONIGHT   PNC  WITH  DR  BOVARD - SEEN LAST  5-26.   NEXT APPOINTMENT   IS 6-10.   DENIES HSV AND MRSA.   VE IN OFFICE -  CLOSED.   DENIES UC.  LAST SEX-  LAST WEEK.

## 2015-03-31 DIAGNOSIS — A749 Chlamydial infection, unspecified: Secondary | ICD-10-CM

## 2015-03-31 HISTORY — DX: Chlamydial infection, unspecified: A74.9

## 2015-03-31 LAB — OB RESULTS CONSOLE GC/CHLAMYDIA
CHLAMYDIA, DNA PROBE: POSITIVE
Gonorrhea: NEGATIVE

## 2015-03-31 LAB — OB RESULTS CONSOLE GBS: GBS: NEGATIVE

## 2015-04-30 ENCOUNTER — Inpatient Hospital Stay (HOSPITAL_COMMUNITY)
Admission: AD | Admit: 2015-04-30 | Discharge: 2015-05-03 | DRG: 775 | Disposition: A | Discharge status: Home / Self Care | Payer: Medicaid Other | Source: Ambulatory Visit | Attending: Obstetrics and Gynecology | Admitting: Obstetrics and Gynecology

## 2015-04-30 ENCOUNTER — Encounter (HOSPITAL_COMMUNITY): Payer: Self-pay | Admitting: *Deleted

## 2015-04-30 DIAGNOSIS — D696 Thrombocytopenia, unspecified: Secondary | ICD-10-CM | POA: Diagnosis present

## 2015-04-30 DIAGNOSIS — Z8249 Family history of ischemic heart disease and other diseases of the circulatory system: Secondary | ICD-10-CM | POA: Diagnosis not present

## 2015-04-30 DIAGNOSIS — O4292 Full-term premature rupture of membranes, unspecified as to length of time between rupture and onset of labor: Secondary | ICD-10-CM | POA: Diagnosis present

## 2015-04-30 DIAGNOSIS — Z3A39 39 weeks gestation of pregnancy: Secondary | ICD-10-CM | POA: Diagnosis present

## 2015-04-30 DIAGNOSIS — K219 Gastro-esophageal reflux disease without esophagitis: Secondary | ICD-10-CM | POA: Diagnosis present

## 2015-04-30 DIAGNOSIS — O9962 Diseases of the digestive system complicating childbirth: Secondary | ICD-10-CM | POA: Diagnosis present

## 2015-04-30 DIAGNOSIS — O9912 Other diseases of the blood and blood-forming organs and certain disorders involving the immune mechanism complicating childbirth: Secondary | ICD-10-CM | POA: Diagnosis present

## 2015-04-30 DIAGNOSIS — O4290 Premature rupture of membranes, unspecified as to length of time between rupture and onset of labor, unspecified weeks of gestation: Secondary | ICD-10-CM | POA: Diagnosis present

## 2015-04-30 DIAGNOSIS — O9989 Other specified diseases and conditions complicating pregnancy, childbirth and the puerperium: Secondary | ICD-10-CM | POA: Diagnosis present

## 2015-04-30 HISTORY — DX: Chlamydial infection, unspecified: A74.9

## 2015-04-30 HISTORY — DX: Anxiety disorder, unspecified: F41.9

## 2015-04-30 HISTORY — DX: Personal history of other specified conditions: Z87.898

## 2015-04-30 LAB — CBC
HEMATOCRIT: 37.4 % (ref 36.0–46.0)
Hemoglobin: 13.1 g/dL (ref 12.0–15.0)
MCH: 31.5 pg (ref 26.0–34.0)
MCHC: 35 g/dL (ref 30.0–36.0)
MCV: 89.9 fL (ref 78.0–100.0)
PLATELETS: 108 10*3/uL — AB (ref 150–400)
RBC: 4.16 MIL/uL (ref 3.87–5.11)
RDW: 13.8 % (ref 11.5–15.5)
WBC: 7 10*3/uL (ref 4.0–10.5)

## 2015-04-30 LAB — POCT FERN TEST: POCT FERN TEST: POSITIVE

## 2015-04-30 LAB — TYPE AND SCREEN
ABO/RH(D): AB POS
Antibody Screen: NEGATIVE

## 2015-04-30 MED ORDER — LACTATED RINGERS IV SOLN
INTRAVENOUS | Status: DC
  Administered 2015-04-30 – 2015-05-01 (×4): via INTRAVENOUS

## 2015-04-30 MED ORDER — BUTORPHANOL TARTRATE 1 MG/ML IJ SOLN
1.0000 mg | INTRAMUSCULAR | Status: DC | PRN
  Administered 2015-04-30: 1 mg via INTRAVENOUS
  Filled 2015-04-30: qty 1

## 2015-04-30 MED ORDER — TERBUTALINE SULFATE 1 MG/ML IJ SOLN
0.2500 mg | Freq: Once | INTRAMUSCULAR | Status: AC | PRN
  Filled 2015-04-30: qty 1

## 2015-04-30 MED ORDER — CITRIC ACID-SODIUM CITRATE 334-500 MG/5ML PO SOLN: 30.0000 mL | ORAL | Status: DC | PRN

## 2015-04-30 MED ORDER — OXYTOCIN 40 UNITS IN LACTATED RINGERS INFUSION - SIMPLE MED
1.0000 m[IU]/min | INTRAVENOUS | Status: DC
Start: 2015-04-30 — End: 2015-05-01
  Administered 2015-04-30: 2 m[IU]/min via INTRAVENOUS
  Filled 2015-04-30: qty 1000

## 2015-04-30 MED ORDER — OXYTOCIN BOLUS FROM INFUSION
500.0000 mL | INTRAVENOUS | Status: DC
  Administered 2015-05-01: 500 mL via INTRAVENOUS

## 2015-04-30 MED ORDER — OXYTOCIN 40 UNITS IN LACTATED RINGERS INFUSION - SIMPLE MED: 62.5000 mL/h | INTRAVENOUS | Status: DC

## 2015-04-30 MED ORDER — ONDANSETRON HCL 4 MG/2ML IJ SOLN: 4.0000 mg | Freq: Four times a day (QID) | INTRAMUSCULAR | Status: DC | PRN

## 2015-04-30 MED ORDER — OXYCODONE-ACETAMINOPHEN 5-325 MG PO TABS: 1.0000 | ORAL_TABLET | ORAL | Status: DC | PRN

## 2015-04-30 MED ORDER — LIDOCAINE HCL (PF) 1 % IJ SOLN
30.0000 mL | INTRAMUSCULAR | Status: DC | PRN
  Filled 2015-04-30: qty 30

## 2015-04-30 MED ORDER — LACTATED RINGERS IV SOLN
500.0000 mL | INTRAVENOUS | Status: DC | PRN
  Administered 2015-05-01 (×2): 500 mL via INTRAVENOUS

## 2015-04-30 MED ORDER — OXYCODONE-ACETAMINOPHEN 5-325 MG PO TABS: 2.0000 | ORAL_TABLET | ORAL | Status: DC | PRN

## 2015-04-30 NOTE — MAU Note (Addendum)
Pt noticed some leaking, not a lot but it has been slowly leaking since friday.  Denies VB.  Has had back pain on and off, today rates pain 8/10.  Took tylenol, helped pain.

## 2015-05-01 ENCOUNTER — Inpatient Hospital Stay (HOSPITAL_COMMUNITY): Payer: Medicaid Other | Admitting: Anesthesiology

## 2015-05-01 ENCOUNTER — Encounter (HOSPITAL_COMMUNITY): Payer: Self-pay | Admitting: Anesthesiology

## 2015-05-01 LAB — CBC
HEMATOCRIT: 36.5 % (ref 36.0–46.0)
Hemoglobin: 12.9 g/dL (ref 12.0–15.0)
MCH: 31.4 pg (ref 26.0–34.0)
MCHC: 35.3 g/dL (ref 30.0–36.0)
MCV: 88.8 fL (ref 78.0–100.0)
Platelets: 85 10*3/uL — ABNORMAL LOW (ref 150–400)
RBC: 4.11 MIL/uL (ref 3.87–5.11)
RDW: 13.8 % (ref 11.5–15.5)
WBC: 9.7 10*3/uL (ref 4.0–10.5)

## 2015-05-01 LAB — RPR: RPR Ser Ql: NONREACTIVE

## 2015-05-01 MED ORDER — BENZOCAINE-MENTHOL 20-0.5 % EX AERO
1.0000 "application " | INHALATION_SPRAY | CUTANEOUS | Status: DC | PRN
  Administered 2015-05-01: 1 via TOPICAL
  Filled 2015-05-01: qty 56

## 2015-05-01 MED ORDER — ACETAMINOPHEN 325 MG PO TABS
650.0000 mg | ORAL_TABLET | ORAL | Status: DC | PRN
  Administered 2015-05-01: 650 mg via ORAL
  Filled 2015-05-01: qty 2

## 2015-05-01 MED ORDER — ZOLPIDEM TARTRATE 5 MG PO TABS: 5.0000 mg | ORAL_TABLET | Freq: Every evening | ORAL | Status: DC | PRN

## 2015-05-01 MED ORDER — DIPHENHYDRAMINE HCL 25 MG PO CAPS: 25.0000 mg | ORAL_CAPSULE | Freq: Four times a day (QID) | ORAL | Status: DC | PRN

## 2015-05-01 MED ORDER — PHENYLEPHRINE 40 MCG/ML (10ML) SYRINGE FOR IV PUSH (FOR BLOOD PRESSURE SUPPORT): 80.0000 ug | PREFILLED_SYRINGE | INTRAVENOUS | Status: DC | PRN

## 2015-05-01 MED ORDER — OXYTOCIN 40 UNITS IN LACTATED RINGERS INFUSION - SIMPLE MED: 1.0000 m[IU]/min | INTRAVENOUS | Status: DC

## 2015-05-01 MED ORDER — SENNOSIDES-DOCUSATE SODIUM 8.6-50 MG PO TABS
2.0000 | ORAL_TABLET | ORAL | Status: DC
  Administered 2015-05-01 – 2015-05-02 (×2): 2 via ORAL
  Filled 2015-05-01 (×2): qty 2

## 2015-05-01 MED ORDER — SIMETHICONE 80 MG PO CHEW: 80.0000 mg | CHEWABLE_TABLET | ORAL | Status: DC | PRN

## 2015-05-01 MED ORDER — OXYCODONE-ACETAMINOPHEN 5-325 MG PO TABS: 2.0000 | ORAL_TABLET | ORAL | Status: DC | PRN

## 2015-05-01 MED ORDER — FAMOTIDINE 20 MG PO TABS: 20.0000 mg | ORAL_TABLET | Freq: Every day | ORAL | Status: DC

## 2015-05-01 MED ORDER — DIBUCAINE 1 % RE OINT: 1.0000 "application " | TOPICAL_OINTMENT | RECTAL | Status: DC | PRN

## 2015-05-01 MED ORDER — FENTANYL 2.5 MCG/ML BUPIVACAINE 1/10 % EPIDURAL INFUSION (WH - ANES)
14.0000 mL/h | INTRAMUSCULAR | Status: DC | PRN
  Filled 2015-05-01: qty 125

## 2015-05-01 MED ORDER — EPHEDRINE 5 MG/ML INJ
10.0000 mg | INTRAVENOUS | Status: DC | PRN
  Filled 2015-05-01: qty 2

## 2015-05-01 MED ORDER — ONDANSETRON HCL 4 MG/2ML IJ SOLN: 4.0000 mg | INTRAMUSCULAR | Status: DC | PRN

## 2015-05-01 MED ORDER — DIPHENHYDRAMINE HCL 50 MG/ML IJ SOLN: 12.5000 mg | INTRAMUSCULAR | Status: DC | PRN

## 2015-05-01 MED ORDER — LIDOCAINE HCL (PF) 1 % IJ SOLN
INTRAMUSCULAR | Status: DC | PRN
  Administered 2015-05-01 (×2): 4 mL

## 2015-05-01 MED ORDER — PRENATAL MULTIVITAMIN CH
1.0000 | ORAL_TABLET | Freq: Every day | ORAL | Status: DC
  Administered 2015-05-02 – 2015-05-03 (×2): 1 via ORAL
  Filled 2015-05-01 (×2): qty 1

## 2015-05-01 MED ORDER — ACETAMINOPHEN 325 MG PO TABS: 650.0000 mg | ORAL_TABLET | ORAL | Status: DC | PRN

## 2015-05-01 MED ORDER — IBUPROFEN 600 MG PO TABS
600.0000 mg | ORAL_TABLET | Freq: Four times a day (QID) | ORAL | Status: DC
  Administered 2015-05-01 – 2015-05-03 (×8): 600 mg via ORAL
  Filled 2015-05-01 (×8): qty 1

## 2015-05-01 MED ORDER — LANOLIN HYDROUS EX OINT: TOPICAL_OINTMENT | CUTANEOUS | Status: DC | PRN

## 2015-05-01 MED ORDER — WITCH HAZEL-GLYCERIN EX PADS: 1.0000 "application " | MEDICATED_PAD | CUTANEOUS | Status: DC | PRN

## 2015-05-01 MED ORDER — OXYCODONE-ACETAMINOPHEN 5-325 MG PO TABS: 1.0000 | ORAL_TABLET | ORAL | Status: DC | PRN

## 2015-05-01 MED ORDER — ONDANSETRON HCL 4 MG PO TABS: 4.0000 mg | ORAL_TABLET | ORAL | Status: DC | PRN

## 2015-05-01 MED ORDER — PHENYLEPHRINE 40 MCG/ML (10ML) SYRINGE FOR IV PUSH (FOR BLOOD PRESSURE SUPPORT)
80.0000 ug | PREFILLED_SYRINGE | INTRAVENOUS | Status: DC | PRN
  Filled 2015-05-01: qty 20
  Filled 2015-05-01: qty 2

## 2015-05-01 MED ORDER — FENTANYL 2.5 MCG/ML BUPIVACAINE 1/10 % EPIDURAL INFUSION (WH - ANES)
12.0000 mL/h | INTRAMUSCULAR | Status: DC | PRN
  Administered 2015-05-01 (×2): 12 mL/h via EPIDURAL
  Filled 2015-05-01: qty 125

## 2015-05-01 MED ORDER — LACTATED RINGERS IV SOLN: INTRAVENOUS | Status: DC

## 2015-05-01 NOTE — Anesthesia Procedure Notes (Signed)
Epidural Patient location during procedure: OB Start time: 05/01/2015 1:11 AM  Staffing Anesthesiologist: Mal AmabileFOSTER, Hye Trawick Performed by: anesthesiologist   Preanesthetic Checklist Completed: patient identified, site marked, surgical consent, pre-op evaluation, timeout performed, IV checked, risks and benefits discussed and monitors and equipment checked  Epidural Patient position: sitting Prep: site prepped and draped and DuraPrep Patient monitoring: continuous pulse ox and blood pressure Approach: midline Location: L3-L4 Injection technique: LOR air  Needle:  Needle type: Tuohy  Needle gauge: 17 G Needle length: 9 cm and 9 Needle insertion depth: 7 cm Catheter type: closed end flexible Catheter size: 19 Gauge Catheter at skin depth: 12 cm Test dose: negative and Other  Assessment Events: blood not aspirated, injection not painful, no injection resistance, negative IV test and no paresthesia  Additional Notes Patient identified. Risks and benefits discussed including failed block, incomplete  Pain control, post dural puncture headache, nerve damage, paralysis, blood pressure Changes, nausea, vomiting, reactions to medications-both toxic and allergic and post Partum back pain. All questions were answered. Patient expressed understanding and wished to proceed. Sterile technique was used throughout procedure. Epidural site was Dressed with sterile barrier dressing. No paresthesias, signs of intravascular injection Or signs of intrathecal spread were encountered.  Patient was more comfortable after the epidural was dosed. Please see RN's note for documentation of vital signs and FHR which are stable.

## 2015-05-01 NOTE — Progress Notes (Signed)
Thrombocytopenia of 108 when epidural placed. Repeat post delivery was 85. Will keep epidural in place overnight and repeat cbc in the am to determine when to pull out epidural catheter. -MJudd

## 2015-05-01 NOTE — Anesthesia Preprocedure Evaluation (Signed)
Anesthesia Evaluation  Patient identified by MRN, date of birth, ID band Patient awake    Reviewed: Allergy & Precautions, H&P , Patient's Chart, lab work & pertinent test results  Airway Mallampati: III  TM Distance: >3 FB Neck ROM: full    Dental no notable dental hx. (+) Teeth Intact   Pulmonary neg pulmonary ROS,  breath sounds clear to auscultation  Pulmonary exam normal       Cardiovascular negative cardio ROS Normal cardiovascular examRhythm:regular Rate:Normal     Neuro/Psych negative neurological ROS  negative psych ROS   GI/Hepatic Neg liver ROS, GERD-  Medicated,  Endo/Other  Obesity   Renal/GU negative Renal ROS  negative genitourinary   Musculoskeletal   Abdominal   Peds  Hematology Thrombocytopenia   Anesthesia Other Findings   Reproductive/Obstetrics (+) Pregnancy                             Anesthesia Physical Anesthesia Plan  ASA: II  Anesthesia Plan: Epidural   Post-op Pain Management:    Induction:   Airway Management Planned:   Additional Equipment:   Intra-op Plan:   Post-operative Plan:   Informed Consent: I have reviewed the patients History and Physical, chart, labs and discussed the procedure including the risks, benefits and alternatives for the proposed anesthesia with the patient or authorized representative who has indicated his/her understanding and acceptance.     Plan Discussed with: Anesthesiologist  Anesthesia Plan Comments:         Anesthesia Quick Evaluation

## 2015-05-01 NOTE — Lactation Note (Signed)
This note was copied from the chart of [name]. Lactation Consultation Note  Patient Name: Tammy Luella CookShameka Welch ZOXWR'UToday's Date: 05/01/2015 Reason for consult: Initial assessment   Initial consult at 6 hours; GA 39.4; BW 7 lbs, 2.8 oz;  Mom is a P1. Infant has breastfed x1 (15 min) + attempt x1 (0 min); voids-0; stools-0 since birth 6 hours ago. Infant was STS with mom but [facility] cues [facility] mom called for assistance. Hand expression taught with return demonstration and observation of colostrum easily expressed. Taught mom to sandwich breast and asymmetrical latching technique.  Taught dad how to assist with latching using teacup hold and flanging bottom lip if needed. Infant easily latched to breast with wide mouth and flanged lips in cross-cradle hold on right side.  Infant fed in a consistent pattern with minimal stimulation to keep her sucking.  Swallows heard; LS-8. Educated on feeding cues, cluster feeding, and size of infant's stomach.   Lactation brochure given and informed of hospital support group and outpatient services. Encouraged to call for assistance as needed.  Report given to RN of consult.    Maternal Data Has patient been taught Hand Expression?: Yes Does the patient have breastfeeding experience prior to this delivery?: No  Feeding Feeding Type: Breast Fed  LATCH Score/Interventions Latch: Grasps breast easily, tongue down, lips flanged, rhythmical sucking. Intervention(s): Breast compression;Assist with latch;Adjust position  Audible Swallowing: A few with stimulation Intervention(s): Skin to skin;Hand expression  Type of Nipple: Everted at rest and after stimulation  Comfort (Breast/Nipple): Soft / non-tender     Hold (Positioning): Assistance needed to correctly position infant at breast and maintain latch. Intervention(s): Breastfeeding basics reviewed;Support Pillows;Skin to skin  LATCH Score: 8  Lactation Tools  Discussed/Used WIC Program: Yes   Consult Status Consult Status: Follow-up Date: 05/02/15 Follow-up type: In-patient    Lendon KaVann, Teshara Moree Walker 05/01/2015, 7:40 PM

## 2015-05-01 NOTE — Progress Notes (Signed)
Patient ID: Tammy KarvonenShameka A Welch, female   DOB: January 28, 1991, 24 y.o.   MRN: [id number]  Doing well.  Comfortable with epidural  AFVSS gen MAD FHTs 130's, good var, some earlies and variables, category 2 toco q 1-6  5.5/80-90/0-1  Continue current mgmt, close monitoring

## 2015-05-01 NOTE — Anesthesia Postprocedure Evaluation (Signed)
  Anesthesia Post-op Note  Patient: Tammy Welch  Procedure(s) Performed: * No procedures listed *  Patient Location: PACU and Mother/Baby  Anesthesia Type:Epidural  Level of Consciousness: awake, alert , oriented and patient cooperative  Airway and Oxygen Therapy: Patient Spontanous Breathing  Post-op Pain: none  Post-op Assessment: Post-op Vital signs reviewed, Patient's Cardiovascular Status Stable, Respiratory Function Stable, Patent Airway, No signs of Nausea or vomiting, Adequate PO intake, Pain level controlled, No headache, No backache and Patient able to bend at knees              Post-op Vital Signs: Reviewed and stable  Last Vitals:  Filed Vitals:   05/01/15 1700  BP: 119/66  Pulse: 72  Temp: 37.1 C  Resp: 16    Complications: No apparent anesthesia complications

## 2015-05-01 NOTE — H&P (Signed)
Tammy KarvonenShameka A Welch is a 24 y.o. female, G1P0, EGA 39+ weeks with EDC 7-15 presenting last pm for evaluation of leaking fluid.  Eval in MAU with +fern and fluid leak with exam.  Pt admitted and started on pitocin.  Prenatal care complicated by + chlamydia with recent neg TOC.  Maternal Medical History:  Reason for admission: Rupture of membranes.   Fetal activity: Perceived fetal activity is normal.    Prenatal complications: no prenatal complications   OB History    Gravida Para Term Preterm AB TAB SAB Ectopic Multiple Living   1              Past Medical History  Diagnosis Date  . Boil of buttock   . Anxiety     no meds   History reviewed. No pertinent past surgical history. Family History: family history includes Hypertension in her other. Social History:  reports that she has never smoked. She does not have any smokeless tobacco history on file. She reports that she does not drink alcohol or use illicit drugs.   Prenatal Transfer Tool  Maternal Diabetes: No Genetic Screening: Declined Maternal Ultrasounds/Referrals: Normal Fetal Ultrasounds or other Referrals:  None Maternal Substance Abuse:  No Significant Maternal Medications:  None Significant Maternal Lab Results:  Lab values include: Group B Strep negative Other Comments:  None  Review of Systems  Respiratory: Negative.   Cardiovascular: Negative.     Dilation: 3 Effacement (%): 80 Station: -1 Exam by:: Dr. Jackelyn KnifeMeisinger Blood pressure 100/56, pulse 79, temperature 98.3 F (36.8 C), temperature source Oral, resp. rate 18, height 5' 2.5" (1.588 m), weight 90.266 kg (199 lb), last menstrual period 07/21/2014. Maternal Exam:  Uterine Assessment: Contraction strength is moderate.  Contraction frequency is irregular.   Abdomen: Patient reports no abdominal tenderness. Estimated fetal weight is 7 1/2 lbs.   Fetal presentation: vertex  Introitus: Normal vulva. Normal vagina.  Ferning test: positive.  Amniotic fluid  character: clear.  Pelvis: adequate for delivery.   Cervix: Cervix evaluated by digital exam.     Fetal Exam Fetal Monitor Review: Mode: ultrasound.   Baseline rate: 130.  Variability: moderate (6-25 bpm).   Pattern: accelerations present, variable decelerations and early decelerations.    Fetal State Assessment: Category II - tracings are indeterminate.     Physical Exam  Vitals reviewed. Constitutional: She appears well-developed and well-nourished.  Cardiovascular: Normal rate, regular rhythm and normal heart sounds.   No murmur heard. Respiratory: Effort normal and breath sounds normal. No respiratory distress. She has no wheezes.  GI: Soft.    Prenatal labs: ABO, Rh: --/--/AB POS (07/10 1625) Antibody: NEG (07/10 1625) Rubella: Immune (01/20 0000) RPR: Non Reactive (07/10 1625)  HBsAg: Negative (01/20 0000)  HIV: Non-reactive (01/20 0000)  GBS: Negative (06/10 0000)   Assessment/Plan: IUP at 39+ weeks with PROM.  Pt on pitocin which had to be stopped after her epidural due to tachysystole and FHR decel.  Now back on low dose pitocin but does not have adequate ctx yet.  She is comfortable with epidural, discussed this process may take awhile.  FHT currently reassuring, will increase pitocin to get ctx more adequate and monitor progress.   Tammy Welch D 05/01/2015, 6:51 AM

## 2015-05-01 NOTE — Progress Notes (Signed)
Spoke with Dr Ellyn HackBovard to clarify if wants patient to received Motrin with platelets of 85 post delivery. Dr Ellyn HackBovard says ok to give Motrin.

## 2015-05-02 ENCOUNTER — Encounter (HOSPITAL_COMMUNITY): Payer: Self-pay | Admitting: *Deleted

## 2015-05-02 LAB — CBC
HCT: 33 % — ABNORMAL LOW (ref 36.0–46.0)
Hemoglobin: 11.6 g/dL — ABNORMAL LOW (ref 12.0–15.0)
MCH: 31.6 pg (ref 26.0–34.0)
MCHC: 35.2 g/dL (ref 30.0–36.0)
MCV: 89.9 fL (ref 78.0–100.0)
Platelets: 93 10*3/uL — ABNORMAL LOW (ref 150–400)
RBC: 3.67 MIL/uL — AB (ref 3.87–5.11)
RDW: 14 % (ref 11.5–15.5)
WBC: 14.7 10*3/uL — AB (ref 4.0–10.5)

## 2015-05-02 NOTE — Lactation Note (Signed)
This note was copied from the chart of Tammy Welch. Lactation Consultation Note MBU RN reports baby showing feeding cues, but will not open mouth wide.  Baby has had adequate output.  Fitted mom with a #20 NS she has long finger nails and is unable to apply properly, but FOB is here all night and will help.  Worked with baby on positioning and massage, still will not open mouth.  Baby does not get wide open mouth on NS, and only sucks a few times.  Baby is sleepy, demonstrated waking techniques and still did not help.  Encouraged mom to try with feeding cues or wake baby in a few hours and try again.   Mom will need to use DEBP for pumping if she continues to use NS.  Mom to call for assist as needed.    Patient Name: Tammy Luella CookShameka Agro XBJYN'WToday's Date: 05/02/2015 Reason for consult: Follow-up assessment;Difficult latch   Maternal Data    Feeding Feeding Type: Breast Fed Length of feed:  (few sucks)  LATCH Score/Interventions Latch: Repeated attempts needed to sustain latch, nipple held in mouth throughout feeding, stimulation needed to elicit sucking reflex.  Audible Swallowing: None  Type of Nipple: Everted at rest and after stimulation  Comfort (Breast/Nipple): Soft / non-tender     Hold (Positioning): No assistance needed to correctly position infant at breast. Intervention(s): Breastfeeding basics reviewed;Support Pillows;Position options;Skin to skin  LATCH Score: 7  Lactation Tools Discussed/Used Tools: Nipple Shields Nipple shield size: 20   Consult Status Consult Status: Follow-up Date: 05/03/15 Follow-up type: In-patient    Tammy Welch, Tammy Welch 05/02/2015, 10:39 PM

## 2015-05-02 NOTE — Progress Notes (Signed)
CLINICAL SOCIAL WORK MATERNAL/CHILD NOTE  Patient Details  Name: Tammy Welch MRN: [id number] Date of Birth: 05/01/2015  Date:  05/02/2015  Clinical Social Worker Initiating Note:  Lucita Ferrara, LCSW Date/ Time Initiated:  05/02/15/1315     Child's Name:  Tammy Welch   Legal Guardian:  Lowry Ram (mother) and Beverely Low (father)  Need for Interpreter:  None   Date of Referral:  05/01/15     Reason for Referral:  History of anxiety  Referral Source:  [facility]   Address:  [place], [place] [address]  Phone number:  [phone]   Household Members:  Mother  Natural Supports (not living in the home):  Immediate Family, Spouse/significant other   Professional Supports: None   Employment: Unemployed   Type of Work: previously worked at [employer]   Other Resources:  Physicist, medical , [place] Considerations Which May Impact Care:  None reported  Strengths:  Ability to meet basic needs , Engineer, materials , Home prepared for child    Risk Factors/Current Problems:   1)Mental Health Concerns: History of anxiety approximately a year ago. [name] denied any symptoms during the pregnancy, and denied mental health concerns as she transitions to the postpartum period.   Cognitive State:  Able to Concentrate , Alert , Goal Oriented , Linear Thinking    Mood/Affect:  Calm , Comfortable , Interested , Happy    CSW Assessment:  CSW received request for consult due to [name] presenting with a history of anxiety.  FOB and PGM present in room, and [name] provided consent for assessment to be completed in their presence.  [name] presented in a pleasant mood, displayed a full range in affect. She reported feeling "exhausted" and "tired", but stated that she is excited and looking forward to becoming a mother.  No interactions were noted between [name] and the infant as the FOB was providing  skin to skin during the assessment.    [name] stated that she lives with her mother, and endorsed having a supportive relationship with her. She discussed having additional supports that do not live in her home, and expressed belief that the home is prepared for the infant. [name] shared that she will need to be seeking employment during the next 6 weeks since she left her job at [employer] since she felt unsupported by her employer. [name] discussed how her mother will support her during this time, and feels confident that basic needs are met.  [name] reported belief that her transition to postpartum is going well thus far. She stated that she feels comfortable interacting with the infant and caring for her. [name] discussed additional belief that breastfeeding is going well. She stated that she approached breastfeeding with an awareness that it may be difficult, and expressed that she is trying to not set herself up to fail by having too high of expectations for feedings and parenting.  [name] was a limited historian as CSW assessed mental health history. [name] reported that she was diagnosed with anxiety "about a year ago", and stated that it occurred in an emergency department. [name] unable to clarify symptoms or events that may have contributed to anxiety. She stated that she was prescribed a medication that made her "feel funny", and stated that she did not continue with the medication.  Per [name], she has no further symptoms of anxiety.  [name] originally reported feeling "depressed" during the pregnancy,  but upon further exploration, she stated that toward the end of the pregnancy, she felt exhausted and wanted the pregnancy to be over due to physical complaints. She denied any history of SI, reported that she continued to enjoy spending time with others, and that she felt that feelings were all secondary to pregnancy.  [name] presented as attentive and engaged during education on perinatal mood and anxiety disorders. [name] agreed to  contact her medical provider if she notes onset of symptoms.   [name] denied additional questions, concerns, or needs at this time. She expressed appreciation for the visit, and agreed to contact the CSW if needs arise.  CSW Plan/Description:   1)Patient/Family Education: Perinatal mood and anxiety disorders 2)No Further Intervention Required/No Barriers to Discharge    Sharyl Nimrod 05/02/2015, 2:28 PM

## 2015-05-02 NOTE — Progress Notes (Signed)
Post Partum Day 1 Subjective: no complaints, up ad lib, tolerating PO and nl lochia, pain controlled  Objective: Blood pressure 105/68, pulse 73, temperature 97.8 F (36.6 C), temperature source Oral, resp. rate 16, height 5' 2.5" (1.588 m), weight 90.266 kg (199 lb), last menstrual period 07/21/2014, SpO2 100 %, unknown if currently breastfeeding.  Physical Exam:  General: alert and no distress Lochia: appropriate Uterine Fundus: firm   Recent Labs  04/30/15 1625 05/01/15 1406  HGB 13.1 12.9  HCT 37.4 36.5    Assessment/Plan: Plan for discharge tomorrow, Breastfeeding and Lactation consult.  Routine care.     LOS: 2 days   Bovard-Stuckert, Jeweline Reif 05/02/2015, 6:14 AM

## 2015-05-02 NOTE — Progress Notes (Signed)
UR chart review completed.  

## 2015-05-02 NOTE — Lactation Note (Signed)
This note was copied from the [facility]. Lactation Consultation Note  Follow up visit made.  Offered assist with feeding and mom willing.  Assisted positioning baby skin to skin in football hold.  Baby initially sucking tongue but did latch well after a few attempts.  Parents shown how to stimulate baby and use breast massage to keep baby engaged in feeding.  Instructed to feed with any feeding cue and anticipate cluster feedings.  Encouraged to call with concerns/assist prn.  Patient Name: Tammy Welch RUEAV'WToday's Date: 05/02/2015 Reason for consult: Follow-up assessment   Maternal Data    Feeding Feeding Type: Breast Fed Length of feed: 3 min  LATCH Score/Interventions Latch: Grasps breast easily, tongue down, lips flanged, rhythmical sucking. Intervention(s): Breast compression;Breast massage;Assist with latch;Adjust position  Audible Swallowing: A few with stimulation Intervention(s): Alternate breast massage;Hand expression;Skin to skin  Type of Nipple: Everted at rest and after stimulation  Comfort (Breast/Nipple): Soft / non-tender     Hold (Positioning): Assistance needed to correctly position infant at breast and maintain latch. Intervention(s): Breastfeeding basics reviewed;Support Pillows;Position options  LATCH Score: 8  Lactation Tools Discussed/Used     Consult Status Consult Status: Follow-up Date: 05/03/15 Follow-up type: In-patient    Tammy Welch, [name] 05/02/2015, 3:18 PM

## 2015-05-03 MED ORDER — CVS PRENATAL GUMMY 0.4-113.5 MG PO CHEW: 1.0000 | CHEWABLE_TABLET | Freq: Every day | ORAL | Status: DC

## 2015-05-03 MED ORDER — IBUPROFEN 800 MG PO TABS: 800.0000 mg | ORAL_TABLET | Freq: Three times a day (TID) | ORAL | Status: DC | PRN

## 2015-05-03 MED ORDER — OXYCODONE-ACETAMINOPHEN 5-325 MG PO TABS: 1.0000 | ORAL_TABLET | Freq: Four times a day (QID) | ORAL | Status: DC | PRN

## 2015-05-03 NOTE — Discharge Summary (Signed)
Obstetric Discharge Summary Reason for Admission: onset of labor Prenatal Procedures: none Intrapartum Procedures: spontaneous vaginal delivery Postpartum Procedures: none Complications-Operative and Postpartum: 1st degree perineal laceration HEMOGLOBIN  Date Value Ref Range Status  05/02/2015 11.6* 12.0 - 15.0 g/dL Final   HCT  Date Value Ref Range Status  05/02/2015 33.0* 36.0 - 46.0 % Final    Physical Exam:  General: alert and no distress Lochia: appropriate Uterine Fundus: firm   Discharge Diagnoses: Term Pregnancy-delivered  Discharge Information: Date: 05/03/2015 Activity: pelvic rest Diet: routine Medications: PNV, Ibuprofen and Percocet Condition: stable Instructions: refer to practice specific booklet Discharge to: home Follow-up Information    Follow up with Tammy Welch, Tammy Swamy, Tammy Welch. Schedule an appointment as soon as possible for a visit in 6 weeks.   Specialty:  Obstetrics and Gynecology   Why:  for postpartum check   Contact information:   [address] [place] [id number] [phone]       Newborn Data: Live born female  Birth Weight: 7 lb 2.8 oz (3255 g) APGAR: 9, 9  Home with mother.  Tammy Welch, Tammy Welch 05/03/2015, 8:42 AM

## 2015-05-03 NOTE — Progress Notes (Signed)
Post Partum Day 2 Subjective: no complaints, up ad lib, voiding, tolerating PO and nl lochia, pain controlled  Objective: Blood pressure 135/86, pulse 62, temperature 98.3 F (36.8 C), temperature source Oral, resp. rate 17, height 5' 2.5" (1.588 m), weight 90.266 kg (199 lb), last menstrual period 07/21/2014, SpO2 99 %, unknown if currently breastfeeding.  Physical Exam:  General: alert and no distress Lochia: appropriate Uterine Fundus: firm  Recent Labs  05/01/15 1406 05/02/15 0603  HGB 12.9 11.6*  HCT 36.5 33.0*    Assessment/Plan: Plan for discharge tomorrow, Breastfeeding and Lactation consult.  D/C with motrin, percocet and PNV.  F/U 6 wks.     LOS: 3 days   Bovard-Stuckert, Tammy Welch 05/03/2015, 7:43 AM

## 2015-05-03 NOTE — Lactation Note (Signed)
This note was copied from the chart of Tammy Welch. Lactation Consultation Note  Follow up visit made prior to discharge.  Suck training done on gloved finger prior to latch attempt.  Baby kept tongue down and sucked well.  Positioned baby in football hold.  With good breast compression baby opened and latched easily and deep.  Mom denies pain and states she feels strong pulls.  Baby nursing actively.  Feeding plan given and reviewed with mom since baby has not been consistently latching.  [facility]  Instructed mom to do suck training on clean finger some between feeds and prior to latch.  Attempt latch with any feeding cue.  If baby does not latch or has a poor feeding 1) pump both breasts x 15 minutes 2) give baby as directed on day of life volume parameters expressed milk and or formula per bottle.  Instructed to keep feeding diary the first week home.  Lactation outpatient support and services reviewed and encouraged.  Mom states she really wants to breastfeed.  Praised for her efforts.  Patient Name: Tammy Welch WGNFA'OToday's Date: 05/03/2015 Reason for consult: Follow-up assessment;Difficult latch   Maternal Data    Feeding Feeding Type: Breast Fed Length of feed: 20 min  LATCH Score/Interventions Latch: Grasps breast easily, tongue down, lips flanged, rhythmical sucking. Intervention(s): Adjust position;Assist with latch;Breast massage;Breast compression  Audible Swallowing: A few with stimulation Intervention(s): Skin to skin;Hand expression;Alternate breast massage  Type of Nipple: Everted at rest and after stimulation  Comfort (Breast/Nipple): Soft / non-tender     Hold (Positioning): Assistance needed to correctly position infant at breast and maintain latch. Intervention(s): Breastfeeding basics reviewed;Support Pillows;Position options;Skin to skin  LATCH Score: 8  Lactation Tools Discussed/Used     Consult  Status Consult Status: Complete    Tammy Welch, Taijuan Serviss S 05/03/2015, 11:28 AM

## 2015-12-31 ENCOUNTER — Encounter (HOSPITAL_COMMUNITY): Payer: Self-pay | Admitting: Emergency Medicine

## 2015-12-31 ENCOUNTER — Emergency Department (HOSPITAL_COMMUNITY)
Admission: EM | Admit: 2015-12-31 | Discharge: 2015-12-31 | Disposition: A | Discharge status: Home / Self Care | Payer: Medicaid Other | Attending: Emergency Medicine | Admitting: Emergency Medicine

## 2015-12-31 DIAGNOSIS — Z3202 Encounter for pregnancy test, result negative: Secondary | ICD-10-CM | POA: Diagnosis not present

## 2015-12-31 DIAGNOSIS — Z8619 Personal history of other infectious and parasitic diseases: Secondary | ICD-10-CM | POA: Diagnosis not present

## 2015-12-31 DIAGNOSIS — Z8659 Personal history of other mental and behavioral disorders: Secondary | ICD-10-CM | POA: Diagnosis not present

## 2015-12-31 DIAGNOSIS — Z872 Personal history of diseases of the skin and subcutaneous tissue: Secondary | ICD-10-CM | POA: Insufficient documentation

## 2015-12-31 DIAGNOSIS — R197 Diarrhea, unspecified: Secondary | ICD-10-CM | POA: Diagnosis not present

## 2015-12-31 LAB — CBC
HCT: 39.8 % (ref 36.0–46.0)
HEMOGLOBIN: 13.7 g/dL (ref 12.0–15.0)
MCH: 29.7 pg (ref 26.0–34.0)
MCHC: 34.4 g/dL (ref 30.0–36.0)
MCV: 86.1 fL (ref 78.0–100.0)
Platelets: 207 10*3/uL (ref 150–400)
RBC: 4.62 MIL/uL (ref 3.87–5.11)
RDW: 12.9 % (ref 11.5–15.5)
WBC: 4.1 10*3/uL (ref 4.0–10.5)

## 2015-12-31 LAB — COMPREHENSIVE METABOLIC PANEL
ALT: 13 U/L — ABNORMAL LOW (ref 14–54)
ANION GAP: 7 (ref 5–15)
AST: 16 U/L (ref 15–41)
Albumin: 4.1 g/dL (ref 3.5–5.0)
Alkaline Phosphatase: 52 U/L (ref 38–126)
BILIRUBIN TOTAL: 0.6 mg/dL (ref 0.3–1.2)
BUN: 13 mg/dL (ref 6–20)
CO2: 27 mmol/L (ref 22–32)
Calcium: 9.5 mg/dL (ref 8.9–10.3)
Chloride: 106 mmol/L (ref 101–111)
Creatinine, Ser: 0.76 mg/dL (ref 0.44–1.00)
GFR calc non Af Amer: >60 mL/min (ref >60)
Glucose, Bld: 93 mg/dL (ref 65–99)
Potassium: 4.2 mmol/L (ref 3.5–5.1)
SODIUM: 140 mmol/L (ref 135–145)
TOTAL PROTEIN: 6.8 g/dL (ref 6.5–8.1)

## 2015-12-31 LAB — HCG, QUANTITATIVE, PREGNANCY

## 2015-12-31 MED ORDER — LOPERAMIDE HCL 2 MG PO CAPS: 2.0000 mg | ORAL_CAPSULE | Freq: Four times a day (QID) | ORAL | Status: DC | PRN

## 2015-12-31 NOTE — ED Notes (Signed)
Per pt, states diarrhea for a week-no antibiotics-has not been around anyone who has been sick

## 2015-12-31 NOTE — Discharge Instructions (Signed)
There is not appear to be an emergent cause for your symptoms at this time. Take your medication as we discussed with your diarrhea. It is important to stay very well-hydrated. Please drink plenty of water, Gatorade or other electrolyte balanced solution. Follow-up with your doctor as needed. Return to ED for new or worsening symptoms.  Diarrhea Diarrhea is frequent loose and watery bowel movements. It can cause you to feel weak and dehydrated. Dehydration can cause you to become tired and thirsty, have a dry mouth, and have decreased urination that often is dark yellow. Diarrhea is a sign of another problem, most often an infection that will not last long. In most cases, diarrhea typically lasts 2-3 days. However, it can last longer if it is a sign of something more serious. It is important to treat your diarrhea as directed by your caregiver to lessen or prevent future episodes of diarrhea. CAUSES  Some common causes include:  Gastrointestinal infections caused by viruses, bacteria, or parasites.  Food poisoning or food allergies.  Certain medicines, such as antibiotics, chemotherapy, and laxatives.  Artificial sweeteners and fructose.  Digestive disorders. HOME CARE INSTRUCTIONS  Ensure adequate fluid intake (hydration): Have 1 cup (8 oz) of fluid for each diarrhea episode. Avoid fluids that contain simple sugars or sports drinks, fruit juices, whole milk products, and sodas. Your urine should be clear or pale yellow if you are drinking enough fluids. Hydrate with an oral rehydration solution that you can purchase at pharmacies, retail stores, and online. You can prepare an oral rehydration solution at home by mixing the following ingredients together:   - tsp table salt.   tsp baking soda.   tsp salt substitute containing potassium chloride.  1  tablespoons sugar.  1 L (34 oz) of water.  Certain foods and beverages may increase the speed at which food moves through the  gastrointestinal (GI) tract. These foods and beverages should be avoided and include:  Caffeinated and alcoholic beverages.  High-fiber foods, such as raw fruits and vegetables, nuts, seeds, and whole grain breads and cereals.  Foods and beverages sweetened with sugar alcohols, such as xylitol, sorbitol, and mannitol.  Some foods may be well tolerated and may help thicken stool including:  Starchy foods, such as rice, toast, pasta, low-sugar cereal, oatmeal, grits, baked potatoes, crackers, and bagels.  Bananas.  Applesauce.  Add probiotic-rich foods to help increase healthy bacteria in the GI tract, such as yogurt and fermented milk products.  Wash your hands well after each diarrhea episode.  Only take over-the-counter or prescription medicines as directed by your caregiver.  Take a warm bath to relieve any burning or pain from frequent diarrhea episodes. SEEK IMMEDIATE MEDICAL CARE IF:   You are unable to keep fluids down.  You have persistent vomiting.  You have blood in your stool, or your stools are black and tarry.  You do not urinate in 6-8 hours, or there is only a small amount of very dark urine.  You have abdominal pain that increases or localizes.  You have weakness, dizziness, confusion, or light-headedness.  You have a severe headache.  Your diarrhea gets worse or does not get better.  You have a fever or persistent symptoms for more than 2-3 days.  You have a fever and your symptoms suddenly get worse. MAKE SURE YOU:   Understand these instructions.  Will watch your condition.  Will get help right away if you are not doing well or get worse.   This information  is not intended to replace advice given to you by your health care provider. Make sure you discuss any questions you have with your health care provider.   Document Released: 09/27/2002 Document Revised: 10/28/2014 Document Reviewed: 06/14/2012 Elsevier Interactive Patient Education [email or web address]  [employer]vier Inc[employer]

## 2015-12-31 NOTE — ED Provider Notes (Signed)
CSN: [id number]     Arrival date & time 12/31/15  1324 History   First MD Initiated Contact with Patient 12/31/15 1348     Chief Complaint  Patient presents with  . Diarrhea     (Consider location/radiation/quality/duration/timing/severity/associated sxs/prior Treatment) HPI Tammy Welch is a 25 y.o. female who comes in for evaluation of diarrhea. Patient reports she has somewhere between 5 and 7 loose stools per day over the past week. Stool is not overtly dark and is nonbloody. She denies any fevers, chills, nausea or vomiting, abdominal pain, urinary symptoms. No recent hospitalizations, antibiotic use, sick contacts. She tried Pepto-Bismol last night, but that was ineffective. Last normal period was the end of February. Denies any other unusual vaginal bleeding or discharge.  Past Medical History  Diagnosis Date  . Boil of buttock     recurrent  . Anxiety     no meds  . SVD (spontaneous vaginal delivery) 05/01/2015  . Chlamydia 03/31/2015    Treated, TOC 04/20/15  . History of marijuana use 03/25/2014   History reviewed. No pertinent past surgical history. Family History  Problem Relation Age of Onset  . Hypertension Other    Social History  Substance Use Topics  . Smoking status: Never Smoker   . Smokeless tobacco: None  . Alcohol Use: No     Comment: liquor and beer last week   OB History    Gravida Para Term Preterm AB TAB SAB Ectopic Multiple Living   1 1 1       0 1     Review of Systems A 10 point review of systems was completed and was negative except for pertinent positives and negatives as mentioned in the history of present illness     Allergies  Review of patient's allergies indicates no known allergies.  Home Medications   Prior to Admission medications   Medication Sig Start Date End Date Taking? Authorizing Provider  acetaminophen (TYLENOL) 500 MG tablet Take 1 tablet (500 mg total) by mouth every 6 (six) hours as needed. Patient taking  differently: Take 1,000 mg by mouth every 6 (six) hours as needed.  12/23/14  Yes Chiron Campione, PA-C  bismuth subsalicylate (PEPTO BISMOL) 262 MG/15ML suspension Take 30 mLs by mouth every 6 (six) hours as needed for indigestion.   Yes Historical Provider, MD  loperamide (IMODIUM) 2 MG capsule Take 1 capsule (2 mg total) by mouth 4 (four) times daily as needed for diarrhea or loose stools. 12/31/15   Marquisa Salih, PA-C   BP 130/75 mmHg  Pulse 104  Temp(Src) 98.6 F (37 C) (Oral)  Resp 18  SpO2 99%  LMP 12/19/2015 Physical Exam  Constitutional: She is oriented to person, place, and time. She appears well-developed and well-nourished.  Overall well appearing African-American female, no apparent distress  HENT:  Head: Normocephalic and atraumatic.  Mouth/Throat: Oropharynx is clear and moist.  Eyes: Conjunctivae are normal. Pupils are equal, round, and reactive to light. Right eye exhibits no discharge. Left eye exhibits no discharge. No scleral icterus.  Neck: Neck supple.  Cardiovascular: Normal rate, regular rhythm and normal heart sounds.   Pulmonary/Chest: Effort normal and breath sounds normal. No respiratory distress. She has no wheezes. She has no rales.  Abdominal: Soft. There is no tenderness.  Musculoskeletal: She exhibits no tenderness.  Neurological: She is alert and oriented to person, place, and time.  Cranial Nerves II-XII grossly intact  Skin: Skin is warm and dry. No rash noted.  Psychiatric: She  has a normal mood and affect.  Nursing note and vitals reviewed.   ED Course  Procedures (including critical care time) Labs Review Labs Reviewed  COMPREHENSIVE METABOLIC PANEL - Abnormal; Notable for the following:    ALT 13 (*)    All other components within normal limits  CBC  HCG, QUANTITATIVE, PREGNANCY  URINALYSIS, ROUTINE W REFLEX MICROSCOPIC (NOT AT [facility])    Imaging Review No results found. I have personally reviewed and evaluated these images and lab  results as part of my medical decision-making.   EKG Interpretation None     Meds given in ED:  Medications - No data to display  Discharge Medication List as of 12/31/2015  3:07 PM    START taking these medications   Details  loperamide (IMODIUM) 2 MG capsule Take 1 capsule (2 mg total) by mouth 4 (four) times daily as needed for diarrhea or loose stools., Starting 12/31/2015, Until Discontinued, Print       Filed Vitals:   12/31/15 1332  BP: 130/75  Pulse: 104  Temp: 98.6 F (37 C)  TempSrc: Oral  Resp: 18  SpO2: 99%    MDM  Tammy Welch is a 25 y.o. female presents for evaluation of diarrhea for one week. Diarrhea soft brown, watery stool. No concern for C. difficile or other infectious diarrhea. No recent hospitalizations, antibiotic use. Unremarkable physical exam, completely benign abdominal exam. Vital signs are stable, she is afebrile. No evidence of overt dehydration on exam. She is tolerating  PO's. Symptomatic control at home. Also encouraged aggressive oral rehydration with water, Gatorade or other early chalazion solution. She verbalizes understanding and agrees with this plan as well as subsequent discharge. She knows to return to the emergency department for any new, worsening or other concerning symptoms. Stable for discharge. Final diagnoses:  Diarrhea, unspecified type        Joycie Peek, PA-C 12/31/15 1539  Laurence Spates, MD 01/02/16 2255

## 2016-03-24 ENCOUNTER — Emergency Department (HOSPITAL_COMMUNITY)
Admission: EM | Admit: 2016-03-24 | Discharge: 2016-03-25 | Disposition: A | Discharge status: Home / Self Care | Payer: Medicaid Other | Attending: Emergency Medicine | Admitting: Emergency Medicine

## 2016-03-24 ENCOUNTER — Encounter (HOSPITAL_COMMUNITY): Payer: Self-pay | Admitting: Oncology

## 2016-03-24 DIAGNOSIS — J029 Acute pharyngitis, unspecified: Secondary | ICD-10-CM

## 2016-03-24 LAB — RAPID STREP SCREEN (MED CTR MEBANE ONLY): Streptococcus, Group A Screen (Direct): NEGATIVE

## 2016-03-24 NOTE — ED Notes (Signed)
Pt c/o sore throat since last Tuesday.  Pt rates pain 7/10.  Pt is speaking in full sentences.  Upon entry to room pt was on the phone in NAD.

## 2016-03-25 MED ORDER — IBUPROFEN 600 MG PO TABS: 600.0000 mg | ORAL_TABLET | Freq: Four times a day (QID) | ORAL | Status: DC | PRN

## 2016-03-25 MED ORDER — AMOXICILLIN 500 MG PO CAPS: 500.0000 mg | ORAL_CAPSULE | Freq: Three times a day (TID) | ORAL | Status: DC

## 2016-03-25 MED ORDER — AMOXICILLIN 500 MG PO CAPS
500.0000 mg | ORAL_CAPSULE | Freq: Once | ORAL | Status: AC
  Administered 2016-03-25: 500 mg via ORAL
  Filled 2016-03-25: qty 1

## 2016-03-25 NOTE — ED Provider Notes (Signed)
CSN: [id number]     Arrival date & time 03/24/16  2049 History   First MD Initiated Contact with Patient 03/25/16 0044     Chief Complaint  Patient presents with  . Sore Throat     (Consider location/radiation/quality/duration/timing/severity/associated sxs/prior Treatment) Patient is a 25 y.o. female presenting with pharyngitis. The history is provided by the patient.  Sore Throat This is a new problem. The current episode started in the past 7 days. The problem occurs constantly. The problem has been gradually worsening. Associated symptoms include a sore throat. Pertinent negatives include no abdominal pain, arthralgias, congestion, coughing, fatigue, fever, headaches, nausea, vomiting or weakness. The symptoms are aggravated by eating, drinking and swallowing. She has tried nothing for the symptoms.       Past Medical History  Diagnosis Date  . Boil of buttock     recurrent  . Anxiety     no meds  . SVD (spontaneous vaginal delivery) 05/01/2015  . Chlamydia 03/31/2015    Treated, TOC 04/20/15  . History of marijuana use 03/25/2014   History reviewed. No pertinent past surgical history. Family History  Problem Relation Age of Onset  . Hypertension Other    Social History  Substance Use Topics  . Smoking status: Never Smoker   . Smokeless tobacco: None  . Alcohol Use: No     Comment: liquor and beer last week   OB History    Gravida Para Term Preterm AB TAB SAB Ectopic Multiple Living   0 1     Review of Systems  Constitutional: Negative for fever and fatigue.  HENT: Positive for sore throat. Negative for congestion.   Respiratory: Negative for cough.   Gastrointestinal: Negative for nausea, vomiting and abdominal pain.  Musculoskeletal: Negative for arthralgias.  Neurological: Negative for weakness and headaches.    Review of Systems All other systems negative except as documented in the HPI. All pertinent positives and negatives as reviewed in the  HPI.   Allergies  Review of patient's allergies indicates no known allergies.  Home Medications   Prior to Admission medications   Medication Sig Start Date End Date Taking? Authorizing Provider  acetaminophen (TYLENOL) 500 MG tablet Take 1 tablet (500 mg total) by mouth every 6 (six) hours as needed. Patient taking differently: Take 1,000 mg by mouth every 6 (six) hours as needed.  12/23/14   Joycie Peek, PA-C  amoxicillin (AMOXIL) 500 MG capsule Take 1 capsule (500 mg total) by mouth 3 (three) times daily. 03/25/16   Danah Reinecke Neva Seat, PA-C  bismuth subsalicylate (PEPTO BISMOL) 262 MG/15ML suspension Take 30 mLs by mouth every 6 (six) hours as needed for indigestion.    Historical Provider, MD  ibuprofen (ADVIL,MOTRIN) 600 MG tablet Take 1 tablet (600 mg total) by mouth every 6 (six) hours as needed. 03/25/16   Marlon Pel, PA-C  loperamide (IMODIUM) 2 MG capsule Take 1 capsule (2 mg total) by mouth 4 (four) times daily as needed for diarrhea or loose stools. 12/31/15   Joycie Peek, PA-C   BP 110/67 mmHg  Pulse 72  Temp(Src) 98.3 F (36.8 C) (Oral)  Resp 16  Ht  (1.575 m)  Wt 80.74 kg  BMI 32.55 kg/m2  SpO2 99%  LMP 03/11/2016 (Exact Date) Physical Exam  Constitutional: She is oriented to person, place, and time. She appears well-developed and well-nourished. No distress.  HENT:  Head: Normocephalic and atraumatic.  Right Ear: Tympanic membrane, external ear  and ear canal normal.  Left Ear: Tympanic membrane, external ear and ear canal normal.  Nose: Nose normal. No rhinorrhea. Right sinus exhibits no maxillary sinus tenderness and no frontal sinus tenderness. Left sinus exhibits no maxillary sinus tenderness and no frontal sinus tenderness.  Mouth/Throat: Uvula is midline and mucous membranes are normal. No trismus in the jaw. Normal dentition. No dental abscesses or uvula swelling. Posterior oropharyngeal edema present. No oropharyngeal exudate, posterior oropharyngeal  erythema or tonsillar abscesses.  No submental edema, tongue not elevated, no trismus. No impending airway obstruction; Pt able to speak full sentences, swallow intact, no drooling, stridor, or tonsillar/uvula displacement. No palatal petechia  Eyes: Conjunctivae are normal.  Neck: Trachea normal, normal range of motion and full passive range of motion without pain. Neck supple. No rigidity. Normal range of motion present. No Brudzinski's sign noted.  Flexion and extension of neck without pain or difficulty. Able to breath without difficulty in extension.  Cardiovascular: Normal rate and regular rhythm.   Pulmonary/Chest: Effort normal and breath sounds normal. No stridor. No respiratory distress. She has no wheezes.  Abdominal: Soft. There is no tenderness.  No obvious evidence of splenomegaly. Non ttp.   Musculoskeletal: Normal range of motion.  Lymphadenopathy:       Head (right side): No preauricular and no posterior auricular adenopathy present.       Head (left side): No preauricular and no posterior auricular adenopathy present.    She has cervical adenopathy.  Neurological: She is alert and oriented to person, place, and time.  Skin: Skin is warm and dry. No rash noted. She is not diaphoretic.  Psychiatric: She has a normal mood and affect.  Nursing note and vitals reviewed.   ED Course  Procedures (including critical care time) Labs Review Labs Reviewed  RAPID STREP SCREEN (NOT AT [facility])  CULTURE, GROUP A STREP [facility])    Imaging Review No results found. I have personally reviewed and evaluated these images and lab results as part of my medical decision-making.   EKG Interpretation None      MDM   Final diagnoses:  Sore throat   Pt rapid strep test negative. Pt is tolerating secretions. Presentation not concerning for peritonsillar abscess or spread of infection to deep spaces of the throat; patent airway. Pt will be discharged with Amoxicillin and Ibuprofen.   Specific return precautions discussed. Recommended PCP follow up.  Specific return precautions discussed.  Recommended PCP follow up. Pt appears safe for discharge.   Filed Vitals:   03/24/16 2100 03/25/16 0106  BP: 108/78 110/67  Pulse: 80 72  Temp: 98.6 F (37 C) 98.3 F (36.8 C)  Resp: 18 [address]         Andria Head, PA-C 03/25/16 0148  Shon Batonourtney F Horton, MD 03/25/16 [phone]

## 2016-03-25 NOTE — ED Notes (Signed)
Patient d/c'd self care.  F/U and medications reviewed.  Patient verbalized understanding. 

## 2016-03-25 NOTE — ED Notes (Signed)
Patient resting on bed.  Breathing even and unlabored.  NAD at this time.

## 2016-03-25 NOTE — Discharge Instructions (Signed)
Sore Throat A sore throat is pain, burning, irritation, or scratchiness of the throat. There is often pain or tenderness when swallowing or talking. A sore throat may be accompanied by other symptoms, such as coughing, sneezing, fever, and swollen neck glands. A sore throat is often the first sign of another sickness, such as a cold, flu, strep throat, or mononucleosis (commonly known as mono). Most sore throats go away without medical treatment. CAUSES  The most common causes of a sore throat include:  A viral infection, such as a cold, flu, or mono.  A bacterial infection, such as strep throat, tonsillitis, or whooping cough.  Seasonal allergies.  Dryness in the air.  Irritants, such as smoke or pollution.  Gastroesophageal reflux disease (GERD). HOME CARE INSTRUCTIONS   Only take over-the-counter medicines as directed by your caregiver.  Drink enough fluids to keep your urine clear or pale yellow.  Rest as needed.  Try using throat sprays, lozenges, or sucking on hard candy to ease any pain (if older than 4 years or as directed).  Sip warm liquids, such as broth, herbal tea, or warm water with honey to relieve pain temporarily. You may also eat or drink cold or frozen liquids such as frozen ice pops.  Gargle with salt water (mix 1 tsp salt with 8 oz of water).  Do not smoke and avoid secondhand smoke.  Put a cool-mist humidifier in your bedroom at night to moisten the air. You can also turn on a hot shower and sit in the bathroom with the door closed for 5-10 minutes. SEEK IMMEDIATE MEDICAL CARE IF:  You have difficulty breathing.  You are unable to swallow fluids, soft foods, or your saliva.  You have increased swelling in the throat.  Your sore throat does not get better in 7 days.  You have nausea and vomiting.  You have a fever or persistent symptoms for more than 2-3 days.  You have a fever and your symptoms suddenly get worse. MAKE SURE YOU:   Understand  these instructions.  Will watch your condition.  Will get help right away if you are not doing well or get worse.   This information is not intended to replace advice given to you by your health care provider. Make sure you discuss any questions you have with your health care provider.   Document Released: 11/14/2004 Document Revised: 10/28/2014 Document Reviewed: 06/14/2012 Elsevier Interactive Patient Education 2016 Elsevier Inc.  Pharyngitis Pharyngitis is redness, pain, and swelling (inflammation) of your pharynx.  CAUSES  Pharyngitis is usually caused by infection. Most of the time, these infections are from viruses (viral) and are part of a cold. However, sometimes pharyngitis is caused by bacteria (bacterial). Pharyngitis can also be caused by allergies. Viral pharyngitis may be spread from person to person by coughing, sneezing, and personal items or utensils (cups, forks, spoons, toothbrushes). Bacterial pharyngitis may be spread from person to person by more intimate contact, such as kissing.  SIGNS AND SYMPTOMS  Symptoms of pharyngitis include:   Sore throat.   Tiredness (fatigue).   Low-grade fever.   Headache.  Joint pain and muscle aches.  Skin rashes.  Swollen lymph nodes.  Plaque-like film on throat or tonsils (often seen with bacterial pharyngitis). DIAGNOSIS  Your health care provider will ask you questions about your illness and your symptoms. Your medical history, along with a physical exam, is often all that is needed to diagnose pharyngitis. Sometimes, a rapid strep test is done. Other lab tests  may also be done, depending on the suspected cause.  TREATMENT  Viral pharyngitis will usually get better in 3-4 days without the use of medicine. Bacterial pharyngitis is treated with medicines that kill germs (antibiotics).  HOME CARE INSTRUCTIONS   Drink enough water and fluids to keep your urine clear or pale yellow.   Only take over-the-counter or  prescription medicines as directed by your health care provider:   If you are prescribed antibiotics, make sure you finish them even if you start to feel better.   Do not take aspirin.   Get lots of rest.   Gargle with 8 oz of salt water ( tsp of salt per 1 qt of water) as often as every 1-2 hours to soothe your throat.   Throat lozenges (if you are not at risk for choking) or sprays may be used to soothe your throat. SEEK MEDICAL CARE IF:   You have large, tender lumps in your neck.  You have a rash.  You cough up green, yellow-brown, or bloody spit. SEEK IMMEDIATE MEDICAL CARE IF:   Your neck becomes stiff.  You drool or are unable to swallow liquids.  You vomit or are unable to keep medicines or liquids down.  You have severe pain that does not go away with the use of recommended medicines.  You have trouble breathing (not caused by a stuffy nose). MAKE SURE YOU:   Understand these instructions.  Will watch your condition.  Will get help right away if you are not doing well or get worse.   This information is not intended to replace advice given to you by your health care provider. Make sure you discuss any questions you have with your health care provider.   Document Released: 10/07/2005 Document Revised: 07/28/2013 Document Reviewed: 06/14/2013 Elsevier Interactive Patient Education [employer]2016 Elsevier Inc.

## 2016-03-27 LAB — CULTURE, GROUP A STREP (THRC)

## 2016-12-15 ENCOUNTER — Emergency Department (HOSPITAL_COMMUNITY)
Admission: EM | Admit: 2016-12-15 | Discharge: 2016-12-15 | Disposition: A | Discharge status: Home / Self Care | Payer: Medicaid Other | Attending: Emergency Medicine | Admitting: Emergency Medicine

## 2016-12-15 ENCOUNTER — Encounter (HOSPITAL_COMMUNITY): Payer: Self-pay | Admitting: *Deleted

## 2016-12-15 DIAGNOSIS — N3001 Acute cystitis with hematuria: Secondary | ICD-10-CM | POA: Insufficient documentation

## 2016-12-15 DIAGNOSIS — Z79899 Other long term (current) drug therapy: Secondary | ICD-10-CM | POA: Insufficient documentation

## 2016-12-15 LAB — URINALYSIS, ROUTINE W REFLEX MICROSCOPIC
Bilirubin Urine: NEGATIVE
GLUCOSE, UA: NEGATIVE mg/dL
KETONES UR: NEGATIVE mg/dL
NITRITE: NEGATIVE
PROTEIN: 100 mg/dL — AB
Specific Gravity, Urine: 1.031 — ABNORMAL HIGH (ref 1.005–1.030)
pH: 6 (ref 5.0–8.0)

## 2016-12-15 LAB — POC URINE PREG, ED: PREG TEST UR: NEGATIVE

## 2016-12-15 MED ORDER — SULFAMETHOXAZOLE-TRIMETHOPRIM 800-160 MG PO TABS: 1.0000 | ORAL_TABLET | Freq: Two times a day (BID) | ORAL | 0 refills | Status: AC

## 2016-12-15 NOTE — ED Triage Notes (Addendum)
Pt c/o blood in her urine since Friday. Reports having abdominal pain that has since subsided with nausea and urinary frequency.

## 2016-12-15 NOTE — ED Provider Notes (Signed)
[facility]EMERGENCY DEPT Provider Note   CSN: [id number] Arrival date & time: 12/15/16  [id number]     History   Chief Complaint Chief Complaint  Patient presents with  . Hematuria    HPI Tammy Welch is a 26 y.o. female.  The history is provided by the patient.  Hematuria  This is a new problem. The current episode started 2 days ago. The problem occurs hourly. The problem has been gradually worsening. Pertinent negatives include no abdominal pain. Exacerbated by: urination. The symptoms are relieved by rest.    Past Medical History:  Diagnosis Date  . Anxiety    no meds  . Boil of buttock    recurrent  . Chlamydia 03/31/2015   Treated, TOC 04/20/15  . History of marijuana use 03/25/2014  . SVD (spontaneous vaginal delivery) 05/01/2015    Patient Active Problem List   Diagnosis Date Noted  . SVD (spontaneous vaginal delivery) 05/01/2015  . PROM (premature rupture of membranes) 04/30/2015    History reviewed. No pertinent surgical history.  OB History    Gravida Para Term Preterm AB Living   1 1 1     1    SAB TAB Ectopic Multiple Live Births         0 1       Home Medications    Prior to Admission medications   Medication Sig Start Date End Date Taking? Authorizing Provider  acetaminophen (TYLENOL) 500 MG tablet Take 1 tablet (500 mg total) by mouth every 6 (six) hours as needed. Patient taking differently: Take 1,000 mg by mouth every 6 (six) hours as needed.  12/23/14   Joycie PeekBenjamin Cartner, PA-C  amoxicillin (AMOXIL) 500 MG capsule Take 1 capsule (500 mg total) by mouth 3 (three) times daily. 03/25/16   Tiffany Neva SeatGreene, PA-C  bismuth subsalicylate (PEPTO BISMOL) 262 MG/15ML suspension Take 30 mLs by mouth every 6 (six) hours as needed for indigestion.    Historical Provider, MD  ibuprofen (ADVIL,MOTRIN) 600 MG tablet Take 1 tablet (600 mg total) by mouth every 6 (six) hours as needed. 03/25/16   Marlon Peliffany Greene, PA-C  loperamide (IMODIUM) 2 MG capsule Take 1 capsule (2 mg  total) by mouth 4 (four) times daily as needed for diarrhea or loose stools. 12/31/15   Joycie PeekBenjamin Cartner, PA-C  sulfamethoxazole-trimethoprim (BACTRIM DS,SEPTRA DS) 800-160 MG tablet Take 1 tablet by mouth 2 (two) times daily. 12/15/16 12/22/16  Zadie Rhineonald Jeramiah Mccaughey, MD    Family History Family History  Problem Relation Age of Onset  . Hypertension Other     Social History Social History  Substance Use Topics  . Smoking status: Never Smoker  . Smokeless tobacco: Never Used  . Alcohol use Yes     Comment: liquor and beer last week     Allergies   Patient has no known allergies.   Review of Systems Review of Systems  Constitutional: Negative for fever.  Gastrointestinal: Negative for abdominal pain and vomiting.  Genitourinary: Positive for dysuria and hematuria. Negative for flank pain and vaginal bleeding.  All other systems reviewed and are negative.    Physical Exam Updated Vital Signs BP 109/73   Pulse 79   Temp 98.6 F (37 C) (Oral)   Resp 16   Ht 5\' 2"  (1.575 m)   Wt 79.6 kg   LMP 12/01/2016   SpO2 100%   BMI 32.10 kg/m   Physical Exam  CONSTITUTIONAL: Well developed/well nourished HEAD: Normocephalic/atraumatic EYES: EOMI/PERRL ENMT: Mucous membranes moist NECK: supple no meningeal  signs SPINE/BACK:entire spine nontender CV: S1/S2 noted, no murmurs/rubs/gallops noted LUNGS: Lungs are clear to auscultation bilaterally, no apparent distress ABDOMEN: soft, nontender, no rebound or guarding, bowel sounds noted throughout abdomen GU:no cva tenderness NEURO: Pt is awake/alert/appropriate, moves all extremitiesx4.  No facial droop.   EXTREMITIES: pulses normal/equal, full ROM SKIN: warm, color normal PSYCH: no abnormalities of mood noted, alert and oriented to situation  ED Treatments / Results  Labs (all labs ordered are listed, but only abnormal results are displayed) Labs Reviewed  URINALYSIS, ROUTINE W REFLEX MICROSCOPIC - Abnormal; Notable for the  following:       Result Value   Color, Urine AMBER (*)    APPearance CLOUDY (*)    Specific Gravity, Urine 1.031 (*)    Hgb urine dipstick LARGE (*)    Protein, ur 100 (*)    Leukocytes, UA SMALL (*)    Bacteria, UA MANY (*)    Squamous Epithelial / LPF TOO NUMEROUS TO COUNT (*)    Non Squamous Epithelial 6-30 (*)    All other components within normal limits  POC URINE PREG, ED    EKG  EKG Interpretation None       Radiology No results found.  Procedures Procedures (including critical care time)  Medications Ordered in ED Medications - No data to display   Initial Impression / Assessment and Plan / ED Course  I have reviewed the triage vital signs and the nursing notes.  Pertinent labs   results that were available during my care of the patient were reviewed by me and considered in my medical decision making (see chart for details).     Will treat for cystitis Pt stable and not septic appearing We discussed strict return precautions  Final Clinical Impressions(s) / ED Diagnoses   Final diagnoses:  Acute cystitis with hematuria    New Prescriptions Discharge Medication List as of 12/15/2016  4:40 AM    START taking these medications   Details  sulfamethoxazole-trimethoprim (BACTRIM DS,SEPTRA DS) 800-160 MG tablet Take 1 tablet by mouth 2 (two) times daily., Starting Sun 12/15/2016, Until Sun 12/22/2016, Print         Zadie Rhine, MD 12/15/16 [phone]

## 2017-04-25 ENCOUNTER — Encounter (HOSPITAL_COMMUNITY): Payer: Self-pay | Admitting: Emergency Medicine

## 2017-04-25 ENCOUNTER — Emergency Department (HOSPITAL_COMMUNITY)
Admission: EM | Admit: 2017-04-25 | Discharge: 2017-04-25 | Disposition: A | Discharge status: Home / Self Care | Payer: Managed Care, Other (non HMO) | Attending: Emergency Medicine | Admitting: Emergency Medicine

## 2017-04-25 DIAGNOSIS — R112 Nausea with vomiting, unspecified: Secondary | ICD-10-CM | POA: Diagnosis not present

## 2017-04-25 DIAGNOSIS — T887XXA Unspecified adverse effect of drug or medicament, initial encounter: Secondary | ICD-10-CM

## 2017-04-25 DIAGNOSIS — F419 Anxiety disorder, unspecified: Secondary | ICD-10-CM | POA: Diagnosis not present

## 2017-04-25 DIAGNOSIS — R110 Nausea: Secondary | ICD-10-CM

## 2017-04-25 LAB — URINALYSIS, ROUTINE W REFLEX MICROSCOPIC
Bacteria, UA: NONE SEEN
Bilirubin Urine: NEGATIVE
GLUCOSE, UA: NEGATIVE mg/dL
Hgb urine dipstick: NEGATIVE
KETONES UR: 80 mg/dL — AB
Nitrite: NEGATIVE
PH: 6 (ref 5.0–8.0)
Protein, ur: NEGATIVE mg/dL
Specific Gravity, Urine: 1.036 — ABNORMAL HIGH (ref 1.005–1.030)

## 2017-04-25 LAB — LIPASE, BLOOD: Lipase: 32 U/L (ref 11–51)

## 2017-04-25 LAB — COMPREHENSIVE METABOLIC PANEL
ALT: 13 U/L — AB (ref 14–54)
AST: 16 U/L (ref 15–41)
Albumin: 4.3 g/dL (ref 3.5–5.0)
Alkaline Phosphatase: 41 U/L (ref 38–126)
Anion gap: 7 (ref 5–15)
BUN: 14 mg/dL (ref 6–20)
CO2: 25 mmol/L (ref 22–32)
Calcium: 9 mg/dL (ref 8.9–10.3)
Chloride: 105 mmol/L (ref 101–111)
Creatinine, Ser: 0.93 mg/dL (ref 0.44–1.00)
GFR calc Af Amer: >60 mL/min (ref >60)
GFR calc non Af Amer: >60 mL/min (ref >60)
GLUCOSE: 90 mg/dL (ref 65–99)
Potassium: 3.4 mmol/L — ABNORMAL LOW (ref 3.5–5.1)
SODIUM: 137 mmol/L (ref 135–145)
Total Bilirubin: 0.7 mg/dL (ref 0.3–1.2)
Total Protein: 6.9 g/dL (ref 6.5–8.1)

## 2017-04-25 LAB — POC URINE PREG, ED: PREG TEST UR: NEGATIVE

## 2017-04-25 LAB — CBC
HCT: 36.9 % (ref 36.0–46.0)
Hemoglobin: 12.2 g/dL (ref 12.0–15.0)
MCH: 28.6 pg (ref 26.0–34.0)
MCHC: 33.1 g/dL (ref 30.0–36.0)
MCV: 86.6 fL (ref 78.0–100.0)
PLATELETS: 243 10*3/uL (ref 150–400)
RBC: 4.26 MIL/uL (ref 3.87–5.11)
RDW: 12.8 % (ref 11.5–15.5)
WBC: 6 10*3/uL (ref 4.0–10.5)

## 2017-04-25 MED ORDER — SODIUM CHLORIDE 0.9 % IV BOLUS (SEPSIS)
1000.0000 mL | Freq: Once | INTRAVENOUS | Status: AC
  Administered 2017-04-25: 1000 mL via INTRAVENOUS

## 2017-04-25 MED ORDER — ONDANSETRON 4 MG PO TBDP
4.0000 mg | ORAL_TABLET | Freq: Once | ORAL | Status: AC | PRN
  Administered 2017-04-25: 4 mg via ORAL

## 2017-04-25 MED ORDER — ONDANSETRON HCL 4 MG PO TABS: ORAL_TABLET | ORAL | 0 refills | Status: DC

## 2017-04-25 MED ORDER — ONDANSETRON 4 MG PO TBDP
ORAL_TABLET | ORAL | Status: AC
  Filled 2017-04-25: qty 1

## 2017-04-25 MED ORDER — ONDANSETRON HCL 4 MG/2ML IJ SOLN
4.0000 mg | Freq: Once | INTRAMUSCULAR | Status: AC
  Administered 2017-04-25: 4 mg via INTRAVENOUS
  Filled 2017-04-25: qty 2

## 2017-04-25 NOTE — ED Triage Notes (Signed)
Pt to ED from home c/o constant nausea since Tuesday. Pt reports she became worried today because she's vomited x 3, denies abd pain/fevers/chills. Pt reports being started on birth control last week and she just came off her menstrual cycle today.

## 2017-04-25 NOTE — Discharge Instructions (Signed)
Drink plenty of fluids. Use the zofran for nausea or vomiting. Unfortunately the metronidazole can cause nausea. Recheck if you get a fever, diarrhea or get dehydrated again.

## 2017-04-25 NOTE — ED Provider Notes (Signed)
[facility] Provider Note   CSN: [id number] Arrival date & time: 04/25/17  0118 By signing my name below, I, Bridgette HabermannMaria Tan, attest that this documentation has been prepared under the direction and in the presence of Devoria AlbeKnapp, Nahara Dona, MD. Electronically Signed: Bridgette HabermannMaria Tan, ED Scribe. 04/25/17. 3:53 AM.  Time seen: 03:44 AM  History   Chief Complaint Chief Complaint  Patient presents with  . Nausea    HPI The history is provided by the patient. No language interpreter was used.   HPI Comments: Benjie KarvonenShameka A Guterrez is a 26 y.o. female (G1P1A0) with h/o anxiety, who presents to the Emergency Department complaining of nausea beginning July 3 with associated x3 episodes of vomiting that began last night. Pt states she started Flagyl and BCP's on July 2 and just came off her menstrual cycle; she states her menstrual cycle was normal. No known sick contacts with similar symptoms, no suspicious food intake. She states she has been working long hours recently. Pt denies fever, chills, abdominal pain, diarrhea, dysuria, urinary frequency, light-headedness, dizziness, or any other associated symptoms.  PCP: None [facility]  Past Medical History:  Diagnosis Date  . Anxiety    no meds  . Boil of buttock    recurrent  . Chlamydia 03/31/2015   Treated, TOC 04/20/15  . History of marijuana use 03/25/2014  . SVD (spontaneous vaginal delivery) 05/01/2015    Patient Active Problem List   Diagnosis Date Noted  . SVD (spontaneous vaginal delivery) 05/01/2015  . PROM (premature rupture of membranes) 04/30/2015    History reviewed. No pertinent surgical history.  OB History    Gravida Para Term Preterm AB Living   1 1 1     1    SAB TAB Ectopic Multiple Live Births         0 1       Home Medications    Prior to Admission medications   Medication Sig Start Date End Date Taking? Authorizing Provider  acetaminophen (TYLENOL) 500 MG tablet Take 1 tablet (500 mg total) by  mouth every 6 (six) hours as needed. Patient taking differently: Take 1,000 mg by mouth every 6 (six) hours as needed for mild pain.  12/23/14  Yes Cartner, Sharlet SalinaBenjamin, PA-C  bismuth subsalicylate (PEPTO BISMOL) 262 MG/15ML suspension Take 30 mLs by mouth every 6 (six) hours as needed for indigestion.   Yes [provider]  ibuprofen (ADVIL,MOTRIN) 600 MG tablet Take 1 tablet (600 mg total) by mouth every 6 (six) hours as needed. Patient taking differently: Take 600 mg by mouth every 6 (six) hours as needed for moderate pain.  03/25/16  Yes Neva SeatGreene, Tiffany, PA-C  levonorgestrel-ethinyl estradiol (VIENVA) 0.1-20 MG-MCG tablet Take 1 tablet by mouth daily.   Yes [provider]  loperamide (IMODIUM) 2 MG capsule Take 1 capsule (2 mg total) by mouth 4 (four) times daily as needed for diarrhea or loose stools. 12/31/15  Yes Cartner, Sharlet SalinaBenjamin, PA-C  metroNIDAZOLE (FLAGYL) 500 MG tablet Take 500 mg by mouth 2 (two) times daily.   Yes [provider]  ondansetron (ZOFRAN) 4 MG tablet Take 1 or 2 po Q 8hrs for nausea 04/25/17   Devoria AlbeKnapp, Masiyah Engen, MD    Family History Family History  Problem Relation Age of Onset  . Hypertension Other     Social History Social History  Substance Use Topics  . Smoking status: Never Smoker  . Smokeless tobacco: Never Used  . Alcohol use Yes     Comment:  liquor and beer last week  employed   Allergies   Patient has no known allergies.   Review of Systems Review of Systems  Constitutional: Negative for chills and fever.  Gastrointestinal: Positive for nausea and vomiting. Negative for abdominal pain and diarrhea.  Genitourinary: Negative for dysuria and frequency.  Neurological: Negative for dizziness and light-headedness.  All other systems reviewed and are negative.  Physical Exam Updated Vital Signs BP 117/76 (BP Location: Left Arm)   Pulse 79   Temp 98.3 F (36.8 C) (Oral)   Resp 18   Ht 5\' 2"  (1.575 m)   Wt 165 lb (74.8 kg)   LMP  04/22/2017   SpO2 100%   BMI 30.18 kg/m   Vital signs normal     Physical Exam  Constitutional: She is oriented to person, place, and time. She appears well-developed and well-nourished.  Non-toxic appearance. She does not appear ill. No distress.  HENT:  Head: Normocephalic and atraumatic.  Right Ear: External ear normal.  Left Ear: External ear normal.  Nose: Nose normal. No mucosal edema or rhinorrhea.  Mouth/Throat: Mucous membranes are dry. No dental abscesses or uvula swelling.  Eyes: Conjunctivae and EOM are normal. Pupils are equal, round, and reactive to light.  Neck: Normal range of motion and full passive range of motion without pain. Neck supple.  Cardiovascular: Normal rate, regular rhythm and normal heart sounds.  Exam reveals no gallop and no friction rub.   No murmur heard. Pulmonary/Chest: Effort normal and breath sounds normal. No respiratory distress. She has no wheezes. She has no rhonchi. She has no rales. She exhibits no tenderness and no crepitus.  Abdominal: Soft. Normal appearance and bowel sounds are normal. She exhibits no distension. There is no tenderness. There is no rebound and no guarding.  Musculoskeletal: Normal range of motion. She exhibits no edema or tenderness.  Moves all extremities well.   Neurological: She is alert and oriented to person, place, and time. She has normal strength. No cranial nerve deficit.  Skin: Skin is warm, dry and intact. No rash noted. No erythema. No pallor.  Psychiatric: She has a normal mood and affect. Her speech is normal and behavior is normal. Her mood appears not anxious.  Nursing note and vitals reviewed.  ED Treatments / Results  Labs (all labs ordered are listed, but only abnormal results are displayed)  Results for orders placed or performed during the hospital encounter of 04/25/17  Lipase, blood  Result Value Ref Range   Lipase 32 11 - 51 U/L  Comprehensive metabolic panel  Result Value Ref Range    Sodium 137 135 - 145 mmol/L   Potassium 3.4 (L) 3.5 - 5.1 mmol/L   Chloride 105 101 - 111 mmol/L   CO2 25 22 - 32 mmol/L   Glucose, Bld 90 65 - 99 mg/dL   BUN 14 6 - 20 mg/dL   Creatinine, Ser 2.13 0.44 - 1.00 mg/dL   Calcium 9.0 8.9 - 08.6 mg/dL   Total Protein 6.9 6.5 - 8.1 g/dL   Albumin 4.3 3.5 - 5.0 g/dL   AST 16 15 - 41 U/L   ALT 13 (L) 14 - 54 U/L   Alkaline Phosphatase 41 38 - 126 U/L   Total Bilirubin 0.7 0.3 - 1.2 mg/dL   GFR calc non Af Amer >60 >60 mL/min   GFR calc Af Amer >60 >60 mL/min   Anion gap 7 5 - 15  CBC  Result Value Ref Range  WBC 6.0 4.0 - 10.5 K/uL   RBC 4.26 3.87 - 5.11 MIL/uL   Hemoglobin 12.2 12.0 - 15.0 g/dL   HCT 16.1 09.6 - 04.5 %   MCV 86.6 78.0 - 100.0 fL   MCH 28.6 26.0 - 34.0 pg   MCHC 33.1 30.0 - 36.0 g/dL   RDW 40.9 81.1 - 91.4 %   Platelets 243 150 - 400 K/uL  Urinalysis, Routine w reflex microscopic  Result Value Ref Range   Color, Urine AMBER (A) YELLOW   APPearance CLEAR CLEAR   Specific Gravity, Urine 1.036 (H) 1.005 - 1.030   pH 6.0 5.0 - 8.0   Glucose, UA NEGATIVE NEGATIVE mg/dL   Hgb urine dipstick NEGATIVE NEGATIVE   Bilirubin Urine NEGATIVE NEGATIVE   Ketones, ur 80 (A) NEGATIVE mg/dL   Protein, ur NEGATIVE NEGATIVE mg/dL   Nitrite NEGATIVE NEGATIVE   Leukocytes, UA SMALL (A) NEGATIVE   RBC / HPF 0-5 0 - 5 RBC/hpf   WBC, UA 0-5 0 - 5 WBC/hpf   Bacteria, UA NONE SEEN NONE SEEN   Squamous Epithelial / LPF 0-5 (A) NONE SEEN   Mucous PRESENT   POC urine preg, ED  Result Value Ref Range   Preg Test, Ur NEGATIVE NEGATIVE   Laboratory interpretation all normal except mild hypokalemia, concentrated UA c/w dehydration   EKG  EKG Interpretation None       Radiology No results found.  Procedures Procedures  Medications Ordered in ED Medications  ondansetron (ZOFRAN-ODT) disintegrating tablet 4 mg (4 mg Oral Given 04/25/17 0153)  sodium chloride 0.9 % bolus 1,000 mL (0 mLs Intravenous Stopped 04/25/17 0640)  sodium  chloride 0.9 % bolus 1,000 mL (0 mLs Intravenous Stopped 04/25/17 0640)  ondansetron (ZOFRAN) injection 4 mg (4 mg Intravenous Given 04/25/17 0456)  ondansetron (ZOFRAN) injection 4 mg (4 mg Intravenous Given 04/25/17 0701)  sodium chloride 0.9 % bolus 1,000 mL (1,000 mLs Intravenous New Bag/Given 04/25/17 0721)     Initial Impression / Assessment and Plan / ED Course  I have reviewed the triage vital signs and the nursing notes.  Pertinent labs & imaging results that were available during my care of the patient were reviewed by me and considered in my medical decision making (see chart for details).  Patient was given IV fluids and IV Zofran in addition to the ODT she was given in triage.  Recheck at 6 AM patient's IVs are only about halfway finished, she states she still has some mild nausea. Pt drove herself to the ED. Hopefully her nausea will improve with more IV fluids.   Recheck at 7:05 AM patient states she's feeling better. Her nausea is controlled now. She is willing to try an oral challenge. She has not had urinary output yet she is given 1 more liter of IV fluids.  Recheck in a.m. patient has been able to drink fluids without feeling worse. Feeling ready to be discharged.  Final Clinical Impressions(s) / ED Diagnoses   Final diagnoses:  Nausea  Non-intractable vomiting with nausea, unspecified vomiting type  Side effect of medication    New Prescriptions New Prescriptions   ONDANSETRON (ZOFRAN) 4 MG TABLET    Take 1 or 2 po Q 8hrs for nausea     I personally performed the services described in this documentation, which was scribed in my presence. The recorded information has been reviewed and considered.  Devoria Albe, MD, Concha Pyo, MD 04/25/17 [phone]

## 2018-04-13 ENCOUNTER — Encounter (HOSPITAL_COMMUNITY): Payer: Self-pay | Admitting: Emergency Medicine

## 2018-04-13 ENCOUNTER — Other Ambulatory Visit: Payer: Self-pay

## 2018-04-13 DIAGNOSIS — Z79899 Other long term (current) drug therapy: Secondary | ICD-10-CM | POA: Diagnosis not present

## 2018-04-13 DIAGNOSIS — L02411 Cutaneous abscess of right axilla: Secondary | ICD-10-CM | POA: Diagnosis not present

## 2018-04-13 DIAGNOSIS — R600 Localized edema: Secondary | ICD-10-CM | POA: Diagnosis present

## 2018-04-13 NOTE — ED Triage Notes (Signed)
Pt arriving with boil in right armpit that has been present x1 week.

## 2018-04-14 ENCOUNTER — Emergency Department (HOSPITAL_COMMUNITY)
Admission: EM | Admit: 2018-04-14 | Discharge: 2018-04-14 | Disposition: A | Discharge status: Home / Self Care | Payer: Managed Care, Other (non HMO) | Attending: Emergency Medicine | Admitting: Emergency Medicine

## 2018-04-14 DIAGNOSIS — L02411 Cutaneous abscess of right axilla: Secondary | ICD-10-CM

## 2018-04-14 MED ORDER — LIDOCAINE-EPINEPHRINE (PF) 2 %-1:200000 IJ SOLN
10.0000 mL | Freq: Once | INTRAMUSCULAR | Status: AC
  Administered 2018-04-14: 10 mL via INTRADERMAL
  Filled 2018-04-14: qty 20

## 2018-04-14 MED ORDER — SULFAMETHOXAZOLE-TRIMETHOPRIM 800-160 MG PO TABS: 1.0000 | ORAL_TABLET | Freq: Two times a day (BID) | ORAL | 0 refills | Status: AC

## 2018-04-14 NOTE — Discharge Instructions (Addendum)
Warm compresses to the area several times a day.  Ibuprofen or Tylenol for pain.  Take antibiotic as prescribed until all gone.  Pull the packing out in 2 days.  Follow-up as needed.  Return if symptoms are worsening.

## 2018-04-14 NOTE — ED Provider Notes (Signed)
[facility]-EMERGENCY DEPT Provider Note   CSN: [id number] Arrival date & time: 04/13/18  2233     History   Chief Complaint Chief Complaint  Patient presents with  . Abscess    right underarm    HPI Tammy Welch is a 27 y.o. female.  HPI Tammy Welch is a 27 y.o. female presents to ED with complaint of an abscess. Pt stats she has noticed an abscess to the right axilla several days ago. States it has gotten larger and painful. Tender to the touch and when moving arm. Nothing makes it better. No drainage. No treatment prior to coming in. Hx of the same. No fever, chills, malaise.   Past Medical History:  Diagnosis Date  . Anxiety    no meds  . Boil of buttock    recurrent  . Chlamydia 03/31/2015   Treated, TOC 04/20/15  . History of marijuana use 03/25/2014  . SVD (spontaneous vaginal delivery) 05/01/2015    Patient Active Problem List   Diagnosis Date Noted  . SVD (spontaneous vaginal delivery) 05/01/2015  . PROM (premature rupture of membranes) 04/30/2015    History reviewed. No pertinent surgical history.   OB History    Gravida  1   Para  1   Term  1   Preterm      AB      Living  1     SAB      TAB      Ectopic      Multiple  0   Live Births  1            Home Medications    Prior to Admission medications   Medication Sig Start Date End Date Taking? Authorizing Provider  levonorgestrel-ethinyl estradiol (LARISSIA) 0.1-20 MG-MCG tablet Take 1 tablet by mouth daily.   Yes [provider]  acetaminophen (TYLENOL) 500 MG tablet Take 1 tablet (500 mg total) by mouth every 6 (six) hours as needed. Patient not taking: Reported on 04/14/2018 12/23/14   Joycie Peekartner, Benjamin, PA-C  ibuprofen (ADVIL,MOTRIN) 600 MG tablet Take 1 tablet (600 mg total) by mouth every 6 (six) hours as needed. Patient not taking: Reported on 04/14/2018 03/25/16   Marlon PelGreene, Tiffany, PA-C  loperamide (IMODIUM) 2 MG capsule Take 1 capsule (2  mg total) by mouth 4 (four) times daily as needed for diarrhea or loose stools. Patient not taking: Reported on 04/14/2018 12/31/15   Joycie Peekartner, Benjamin, PA-C  ondansetron [facility](ZOFRAN) 4 MG tablet Take 1 or 2 po Q 8hrs for nausea Patient not taking: Reported on 04/14/2018 04/25/17   Devoria AlbeKnapp, Iva, MD    Family History Family History  Problem Relation Age of Onset  . Hypertension Other     Social History Social History   Tobacco Use  . Smoking status: Never Smoker  . Smokeless tobacco: Never Used  Substance Use Topics  . Alcohol use: Yes    Comment: liquor and beer last week  . Drug use: No     Allergies   Patient has no known allergies.   Review of Systems Review of Systems  Constitutional: Negative for chills and fever.  Respiratory: Negative for cough, chest tightness and shortness of breath.   Cardiovascular: Negative for chest pain, palpitations and leg swelling.  Genitourinary: Negative for dysuria.  Musculoskeletal: Negative for arthralgias, myalgias, neck pain and neck stiffness.  Skin: Positive for wound. Negative for rash.  Neurological: Negative for dizziness, weakness and headaches.  All other systems  reviewed and are negative.    Physical Exam Updated Vital Signs BP 110/76 (BP Location: Left Arm)   Pulse 68   Temp 98.4 F (36.9 C) (Oral)   Resp 14   Ht 5\' 2"  (1.575 m)   Wt 72.1 kg (159 lb)   LMP 04/13/2018   SpO2 100%   BMI 29.08 kg/m   Physical Exam  Constitutional: She appears well-developed and well-nourished. No distress.  Eyes: Conjunctivae are normal.  Neck: Neck supple.  Neurological: She is alert.  Skin: Skin is warm and dry.  3 x 3 cm abscess to the right axilla.  No drainage.  Tender to palpation.  No surrounding cellulitic changes.  Nursing note and vitals reviewed.    ED Treatments / Results  Labs (all labs ordered are listed, but only abnormal results are displayed) Labs Reviewed - No data to display  EKG None  Radiology No  results found.  Procedures .Marland KitchenIncision and Drainage Date/Time: 04/14/2018 4:57 AM Performed by: Jaynie Crumble, PA-C Authorized by: Jaynie Crumble, PA-C   Consent:    Consent obtained:  Verbal   Consent given by:  Patient   Risks discussed:  Bleeding, incomplete drainage, pain and infection   Alternatives discussed:  No treatment and delayed treatment Location:    Type:  Abscess   Location:  Upper extremity   Upper extremity location:  Arm Pre-procedure details:    Skin preparation:  Betadine Anesthesia (see MAR for exact dosages):    Anesthesia method:  Local infiltration   Local anesthetic:  Lidocaine 2% WITH epi Procedure type:    Complexity:  Simple Procedure details:    Incision types:  Single straight   Scalpel blade:  11   Drainage:  Purulent   Drainage amount:  Moderate   Wound treatment:  Wound left open   Packing materials:  1/4 in gauze Post-procedure details:    Patient tolerance of procedure:  Tolerated well, no immediate complications   (including critical care time)  Medications Ordered in ED Medications  lidocaine-EPINEPHrine (XYLOCAINE W/EPI) 2 %-1:200000 (PF) injection 10 mL (has no administration in time range)     Initial Impression / Assessment and Plan / ED Course  I have reviewed the triage vital signs and the nursing notes.  Pertinent labs & imaging results that were available during my care of the patient were reviewed by me and considered in my medical decision making (see chart for details).     Abscess to the right axilla with mild surrounding cellulitis.  Incised and drained with large purulent drainage.  Packing placed.  Will start on Bactrim.  Patient instructed to pull the packing out in 2 days if it is doing well.  Otherwise follow-up for recheck.  Return precautions discussed.  Vitals:   04/13/18 2328 04/14/18 0233  BP: 115/82 110/76  Pulse: 86 68  Resp: 14 14  Temp: 98.3 F (36.8 C) 98.4 F (36.9 C)  TempSrc: Oral  Oral  SpO2: 100% 100%  Weight: 72.1 kg (159 lb)   Height: 5\' 2"  (1.575 m)      Final Clinical Impressions(s) / ED Diagnoses   Final diagnoses:  Abscess of axilla, right    ED Discharge Orders    None       Jaynie Crumble, PA-C 04/14/18 0459    Molpus, Jonny Ruiz, MD 04/14/18 [phone]

## 2019-03-23 ENCOUNTER — Emergency Department (HOSPITAL_COMMUNITY)
Admission: EM | Admit: 2019-03-23 | Discharge: 2019-03-23 | Disposition: A | Discharge status: Home / Self Care | Payer: Managed Care, Other (non HMO) | Attending: Emergency Medicine | Admitting: Emergency Medicine

## 2019-03-23 DIAGNOSIS — Z79899 Other long term (current) drug therapy: Secondary | ICD-10-CM | POA: Insufficient documentation

## 2019-03-23 DIAGNOSIS — F410 Panic disorder [episodic paroxysmal anxiety] without agoraphobia: Secondary | ICD-10-CM | POA: Diagnosis not present

## 2019-03-23 NOTE — ED Triage Notes (Signed)
Pt here from work and started having an anxiety attack , pt states that she has history of  same , pt does not have her meds with her that shew would normally take

## 2019-03-23 NOTE — ED Triage Notes (Signed)
PT reports a lot of People at work have COVID virus and it is very stressful. At work. Pt reported she had a panic attack  .

## 2019-03-23 NOTE — Discharge Instructions (Addendum)
Please read attached information. If you experience any new or worsening signs or symptoms please return to the emergency room for evaluation. Please follow-up with your primary care provider or specialist as discussed.  °
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
## 2019-07-08 ENCOUNTER — Other Ambulatory Visit: Payer: Self-pay

## 2019-07-08 ENCOUNTER — Emergency Department (HOSPITAL_COMMUNITY): Payer: Managed Care, Other (non HMO)

## 2019-07-08 ENCOUNTER — Encounter (HOSPITAL_COMMUNITY): Payer: Self-pay

## 2019-07-08 ENCOUNTER — Emergency Department (HOSPITAL_COMMUNITY)
Admission: EM | Admit: 2019-07-08 | Discharge: 2019-07-08 | Disposition: A | Discharge status: Home / Self Care | Payer: Managed Care, Other (non HMO) | Attending: Emergency Medicine | Admitting: Emergency Medicine

## 2019-07-08 DIAGNOSIS — M25512 Pain in left shoulder: Secondary | ICD-10-CM | POA: Insufficient documentation

## 2019-07-08 DIAGNOSIS — R0789 Other chest pain: Secondary | ICD-10-CM | POA: Insufficient documentation

## 2019-07-08 DIAGNOSIS — Z79899 Other long term (current) drug therapy: Secondary | ICD-10-CM | POA: Diagnosis not present

## 2019-07-08 HISTORY — DX: Depression, unspecified: F32.A

## 2019-07-08 MED ORDER — IBUPROFEN 800 MG PO TABS
800.0000 mg | ORAL_TABLET | Freq: Once | ORAL | Status: AC
  Administered 2019-07-08: 20:00:00 800 mg via ORAL
  Filled 2019-07-08: qty 1
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
## 2019-07-08 NOTE — Discharge Instructions (Signed)
Take ibuprofen 3 times a day with meals.  Do not take other anti-inflammatories at the same time (Advil, Motrin, naproxen, Aleve). You may supplement with Tylenol if you need further pain control. Use muscle creams (such as salonpas, icy hot, bengay, biofreeze) for pain control.  Use ice packs or heating pads if this helps control your pain. You will likely have continued muscle stiffness and soreness over the next couple days.  Follow-up with primary care in 1 week if your symptoms are not improving. Return to the emergency room if you develop vision changes, vomiting, slurred speech, numbness, loss of bowel or bladder control, or any new or worsening symptoms.
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
## 2019-10-04 DIAGNOSIS — F419 Anxiety disorder, unspecified: Secondary | ICD-10-CM | POA: Insufficient documentation

## 2019-10-04 DIAGNOSIS — F32A Depression, unspecified: Secondary | ICD-10-CM | POA: Insufficient documentation

## 2021-04-18 ENCOUNTER — Emergency Department (HOSPITAL_COMMUNITY)
Admission: EM | Admit: 2021-04-18 | Discharge: 2021-04-19 | Disposition: A | Discharge status: Home / Self Care | Payer: Managed Care, Other (non HMO) | Attending: Emergency Medicine | Admitting: Emergency Medicine

## 2021-04-18 ENCOUNTER — Other Ambulatory Visit: Payer: Self-pay

## 2021-04-18 DIAGNOSIS — R1084 Generalized abdominal pain: Secondary | ICD-10-CM | POA: Diagnosis present

## 2021-04-18 DIAGNOSIS — K529 Noninfective gastroenteritis and colitis, unspecified: Secondary | ICD-10-CM | POA: Insufficient documentation

## 2021-04-18 LAB — COMPREHENSIVE METABOLIC PANEL
ALT: 18 U/L (ref 0–44)
AST: 19 U/L (ref 15–41)
Albumin: 3.9 g/dL (ref 3.5–5.0)
Alkaline Phosphatase: 43 U/L (ref 38–126)
Anion gap: 7 (ref 5–15)
BUN: 11 mg/dL (ref 6–20)
CO2: 25 mmol/L (ref 22–32)
Calcium: 8.5 mg/dL — ABNORMAL LOW (ref 8.9–10.3)
Chloride: 104 mmol/L (ref 98–111)
Creatinine, Ser: 0.86 mg/dL (ref 0.44–1.00)
GFR, Estimated: >60 mL/min (ref >60)
Glucose, Bld: 88 mg/dL (ref 70–99)
Potassium: 3.6 mmol/L (ref 3.5–5.1)
Sodium: 136 mmol/L (ref 135–145)
Total Bilirubin: 0.6 mg/dL (ref 0.3–1.2)
Total Protein: 6.6 g/dL (ref 6.5–8.1)

## 2021-04-18 LAB — I-STAT BETA HCG BLOOD, ED (MC, WL, AP ONLY): I-stat hCG, quantitative: <5 m[IU]/mL (ref <5)

## 2021-04-18 LAB — CBC WITH DIFFERENTIAL/PLATELET
Abs Immature Granulocytes: 0.01 10*3/uL (ref 0.00–0.07)
Basophils Absolute: 0 10*3/uL (ref 0.0–0.1)
Basophils Relative: 1 %
Eosinophils Absolute: 0.1 10*3/uL (ref 0.0–0.5)
Eosinophils Relative: 2 %
HCT: 37.7 % (ref 36.0–46.0)
Hemoglobin: 12.7 g/dL (ref 12.0–15.0)
Immature Granulocytes: 0 %
Lymphocytes Relative: 34 %
Lymphs Abs: 1.6 10*3/uL (ref 0.7–4.0)
MCH: 29.9 pg (ref 26.0–34.0)
MCHC: 33.7 g/dL (ref 30.0–36.0)
MCV: 88.7 fL (ref 80.0–100.0)
Monocytes Absolute: 0.5 10*3/uL (ref 0.1–1.0)
Monocytes Relative: 10 %
Neutro Abs: 2.5 10*3/uL (ref 1.7–7.7)
Neutrophils Relative %: 53 %
Platelets: 198 10*3/uL (ref 150–400)
RBC: 4.25 MIL/uL (ref 3.87–5.11)
RDW: 12.4 % (ref 11.5–15.5)
WBC: 4.8 10*3/uL (ref 4.0–10.5)
nRBC: 0 % (ref 0.0–0.2)

## 2021-04-18 LAB — LIPASE, BLOOD: Lipase: 38 U/L (ref 11–51)

## 2021-04-18 NOTE — ED Triage Notes (Signed)
Pt reports n/v/d since Saturday.  Pt reports that she has not been able to keep anything down and has been having RLQ pain as well.  Denies fevers, denies bloody stool or vomit.

## 2021-04-18 NOTE — ED Provider Notes (Signed)
Emergency Medicine Provider Triage Evaluation Note  Tammy Welch , a 30 y.o. female  was evaluated in triage.  Pt complains of vomiting since Saturday.  Patient endorsed alcohol use on Friday, and has been vomiting since then.  Reports poor p.o. intake.  Has some generalized lower abdominal pain.  Denies any marijuana use.  No alcohol use since Friday.  She is not sure if she could be pregnant.  She states the smell of food makes her want to vomit.  She originally thought it was food poisoning but is concerned given how long this has been ongoing.  Denies vaginal bleeding or discharge  Review of Systems  Positive: As above Negative: As above  Physical Exam  BP 131/89 (BP Location: Left Arm)   Pulse 83   Temp 98 F (36.7 C) (Oral)   Resp 15   Ht 5\' 2"  (1.575 m)   Wt 73.9 kg   SpO2 100%   BMI 29.81 kg/m  Gen:   Awake, no distress   Resp:  Normal effort  MSK:   Moves extremities without difficulty  Other:  Minimal lower abdominal tenderness  Medical Decision Making  Medically screening exam initiated at 7:49 PM.  Appropriate orders placed.  Tammy Welch was informed that the remainder of the evaluation will be completed by another provider, this initial triage assessment does not replace that evaluation, and the importance of remaining in the ED until their evaluation is complete.     04/18/21 1951    04/20/21, MD 04/27/21 [phone]

## 2021-04-19 MED ORDER — PROMETHAZINE HCL 25 MG PO TABS: 25.0000 mg | ORAL_TABLET | Freq: Four times a day (QID) | ORAL | 0 refills | Status: DC | PRN

## 2021-04-19 MED ORDER — SODIUM CHLORIDE 0.9 % IV SOLN
12.5000 mg | Freq: Four times a day (QID) | INTRAVENOUS | Status: DC | PRN
  Administered 2021-04-19: 12.5 mg via INTRAVENOUS
  Filled 2021-04-19: qty 12.5

## 2021-04-19 MED ORDER — FAMOTIDINE IN NACL 20-0.9 MG/50ML-% IV SOLN
20.0000 mg | Freq: Once | INTRAVENOUS | Status: AC
  Administered 2021-04-19: 20 mg via INTRAVENOUS
  Filled 2021-04-19: qty 50

## 2021-04-19 MED ORDER — ONDANSETRON HCL 4 MG/2ML IJ SOLN
4.0000 mg | Freq: Once | INTRAMUSCULAR | Status: AC
  Administered 2021-04-19: 4 mg via INTRAVENOUS
  Filled 2021-04-19: qty 2

## 2021-04-19 MED ORDER — SODIUM CHLORIDE 0.9 % IV BOLUS
1000.0000 mL | Freq: Once | INTRAVENOUS | Status: AC
  Administered 2021-04-19: 1000 mL via INTRAVENOUS

## 2021-04-19 MED ORDER — KETOROLAC TROMETHAMINE 15 MG/ML IJ SOLN
15.0000 mg | Freq: Once | INTRAMUSCULAR | Status: AC
  Administered 2021-04-19: 15 mg via INTRAVENOUS
  Filled 2021-04-19: qty 1

## 2021-04-19 NOTE — ED Provider Notes (Signed)
[facility]-EMERGENCY DEPT Provider Note   CSN: [id number] Arrival date & time: 04/18/21  1931     History Chief Complaint  Patient presents with   Abdominal Pain    Tam[facility] is a 30 y.o. female.  30 year old female presents to the emergency department for evaluation of vomiting and diarrhea which began on Saturday.  She has had too numerous to count episodes of emesis.  Tried Pepto-Bismol for symptoms with minimal improvement.  Describes her diarrhea as watery in nature.  Also reports associated pain in her right lower abdomen.  Tolerated a bag of potato chips in the waiting room without vomiting, though reports it has aggravated her nausea.  No history of abdominal surgeries.  Denies urinary symptoms, vaginal complaints, fever.  The history is provided by the patient. No language interpreter was used.  Abdominal Pain     Past Medical History:  Diagnosis Date   Anxiety    no meds   Boil of buttock    recurrent   Chlamydia 03/31/2015   Treated, TOC 04/20/15   Depression    History of marijuana use 03/25/2014   SVD (spontaneous vaginal delivery) 05/01/2015    Patient Active Problem List   Diagnosis Date Noted   SVD (spontaneous vaginal delivery) 05/01/2015   PROM (premature rupture of membranes) 04/30/2015    No past surgical history on file.   OB History     Gravida  1   Para  1   Term  1   Preterm      AB      Living  1      SAB      IAB      Ectopic      Multiple  0   Live Births  1           Family History  Problem Relation Age of Onset   Hypertension Other    Hypertension Mother     Social History   Tobacco Use   Smoking status: Never   Smokeless tobacco: Never  Vaping Use   Vaping Use: Never used  Substance Use Topics   Alcohol use: Yes    Comment: liquor and beer last week   Drug use: Yes    Types: Marijuana    Home Medications Prior to Admission medications   Medication Sig Start Date End  Date Taking? Authorizing Provider  promethazine (PHENERGAN) 25 MG tablet Take 1 tablet (25 mg total) by mouth every 6 (six) hours as needed for nausea or vomiting. 04/19/21  Yes Antony Madura, PA-C    Allergies    Patient has no known allergies.  Review of Systems   Review of Systems  Gastrointestinal:  Positive for abdominal pain.  Ten systems reviewed and are negative for acute change, except as noted in the HPI.    Physical Exam Updated Vital Signs BP 114/79   Pulse 62   Temp 98 F (36.7 C) (Oral)   Resp 18   Ht 5\' 2"  (1.575 m)   Wt 73.9 kg   LMP 03/18/2021 (Exact Date)   SpO2 100%   BMI 29.81 kg/m   Physical Exam Vitals and nursing note reviewed.  Constitutional:      General: She is not in acute distress.    Appearance: She is well-developed. She is not diaphoretic.     Comments: Nontoxic-appearing and in no distress  HENT:     Head: Normocephalic and atraumatic.  Eyes:  General: No scleral icterus.    Conjunctiva/sclera: Conjunctivae normal.  Cardiovascular:     Rate and Rhythm: Normal rate and regular rhythm.     Pulses: Normal pulses.  Pulmonary:     Effort: Pulmonary effort is normal. No respiratory distress.     Comments: Respirations even and unlabored Abdominal:     Comments: Soft nondistended abdomen with tenderness in the right lower quadrant.  No guarding, peritoneal signs, palpable masses.  Musculoskeletal:        General: Normal range of motion.     Cervical back: Normal range of motion.  Skin:    General: Skin is warm and dry.     Coloration: Skin is not pale.     Findings: No erythema or rash.  Neurological:     Mental Status: She is alert and oriented to person, place, and time.  Psychiatric:        Behavior: Behavior normal.    ED Results / Procedures / Treatments   Labs (all labs ordered are listed, but only abnormal results are displayed) Labs Reviewed  COMPREHENSIVE METABOLIC PANEL - Abnormal; Notable for the following  components:      Result Value   Calcium 8.5 (*)    All other components within normal limits  CBC WITH DIFFERENTIAL/PLATELET  LIPASE, BLOOD  I-STAT BETA HCG BLOOD, ED ([facility], [facility], [facility] ONLY)    EKG None  Radiology No results found.  Procedures Procedures   Medications Ordered in ED Medications  promethazine (PHENERGAN) 12.5 mg in sodium chloride 0.9 % 50 mL IVPB (0 mg Intravenous Stopped 04/19/21 0346)  ketorolac (TORADOL) 15 MG/ML injection 15 mg (15 mg Intravenous Given 04/19/21 0215)  ondansetron (ZOFRAN) injection 4 mg (4 mg Intravenous Given 04/19/21 0215)  sodium chloride 0.9 % bolus 1,000 mL (0 mLs Intravenous Stopped 04/19/21 0324)  famotidine (PEPCID) IVPB 20 mg premix (0 mg Intravenous Stopped 04/19/21 0324)    ED Course  I have reviewed the triage vital signs and the nursing notes.  Pertinent labs & imaging results that were available during my care of the patient were reviewed by me and considered in my medical decision making (see chart for details).  Clinical Course as of 04/19/21 0546  Thu Apr 19, 2021  0315 Repeat abdominal exam is stable. Additional medications ordered for persistent nausea. [KH]  0505 Patient reports that pain has subsided.  Her nausea has improved.  She is open to fluid challenge with water.  She has not had any recurrent vomiting since being roomed in the ED. [KH]  0540 Tolerating PO fluid. No vomiting. [KH]    Clinical Course User Index [KH] Antony Madura, PA-C   MDM Rules/Calculators/A&P                          Patient with symptoms consistent with viral gastroenteritis x 4 days.  Vitals are stable, no fever.  No clinical signs of dehydration; moist mm, turgor normal.  Tolerating PO fluids after antiemetics.  Abdomen soft without masses or peritoneal signs.  Noted to have some tenderness in the right lower quadrant which has improved over ED course.  More likely to clinically be related to adenitis rather than appendicitis.  Appendicitis felt  less likely given lack of fever, leukocytosis, symptom chronicity.  Overall, doubt emergent etiology of symptoms.    Will continue with outpatient management with Phenergan.  Encouraged follow-up with her primary care doctor.  Return precautions discussed and provided. Patient discharged  in stable condition with no unaddressed concerns.   Final Clinical Impression(s) / ED Diagnoses Final diagnoses:  Gastroenteritis    Rx / DC Orders ED Discharge Orders          Ordered    promethazine (PHENERGAN) 25 MG tablet  Every 6 hours PRN        04/19/21 0540             Antony Madura, PA-C 04/19/21 0546    Sabas Sous, MD 04/19/21 [phone]

## 2021-04-19 NOTE — Discharge Instructions (Addendum)
Avoid fried foods, fatty foods, greasy foods, and milk products until symptoms resolve. Drink plenty of clear liquids. We recommend the use of Phenergan as prescribed for nausea/vomiting. Follow-up with your primary care doctor to ensure resolution of symptoms. 

## 2021-07-24 ENCOUNTER — Emergency Department (HOSPITAL_COMMUNITY): Payer: Managed Care, Other (non HMO)

## 2021-07-24 ENCOUNTER — Encounter (HOSPITAL_COMMUNITY): Payer: Self-pay | Admitting: Emergency Medicine

## 2021-07-24 ENCOUNTER — Emergency Department (HOSPITAL_COMMUNITY)
Admission: EM | Admit: 2021-07-24 | Discharge: 2021-07-24 | Disposition: A | Discharge status: Home / Self Care | Payer: Managed Care, Other (non HMO) | Attending: Emergency Medicine | Admitting: Emergency Medicine

## 2021-07-24 DIAGNOSIS — R079 Chest pain, unspecified: Secondary | ICD-10-CM | POA: Diagnosis not present

## 2021-07-24 DIAGNOSIS — R0789 Other chest pain: Secondary | ICD-10-CM

## 2021-07-24 LAB — I-STAT BETA HCG BLOOD, ED (MC, WL, AP ONLY): I-stat hCG, quantitative: <5 m[IU]/mL (ref <5)

## 2021-07-24 LAB — BASIC METABOLIC PANEL
Anion gap: 6 (ref 5–15)
BUN: 11 mg/dL (ref 6–20)
CO2: 28 mmol/L (ref 22–32)
Calcium: 9.1 mg/dL (ref 8.9–10.3)
Chloride: 105 mmol/L (ref 98–111)
Creatinine, Ser: 0.85 mg/dL (ref 0.44–1.00)
GFR, Estimated: >60 mL/min (ref >60)
Glucose, Bld: 92 mg/dL (ref 70–99)
Potassium: 3.8 mmol/L (ref 3.5–5.1)
Sodium: 139 mmol/L (ref 135–145)

## 2021-07-24 LAB — CBC
HCT: 39 % (ref 36.0–46.0)
Hemoglobin: 12.6 g/dL (ref 12.0–15.0)
MCH: 29.4 pg (ref 26.0–34.0)
MCHC: 32.3 g/dL (ref 30.0–36.0)
MCV: 91.1 fL (ref 80.0–100.0)
Platelets: 237 10*3/uL (ref 150–400)
RBC: 4.28 MIL/uL (ref 3.87–5.11)
RDW: 13.2 % (ref 11.5–15.5)
WBC: 6.8 10*3/uL (ref 4.0–10.5)
nRBC: 0 % (ref 0.0–0.2)

## 2021-07-24 LAB — TROPONIN I (HIGH SENSITIVITY)
Troponin I (High Sensitivity): 3 ng/L (ref <18)
Troponin I (High Sensitivity): <2 ng/L (ref <18)

## 2021-07-24 MED ORDER — KETOROLAC TROMETHAMINE 15 MG/ML IJ SOLN
15.0000 mg | Freq: Once | INTRAMUSCULAR | Status: AC
  Administered 2021-07-24: 15 mg via INTRAMUSCULAR
  Filled 2021-07-24: qty 1

## 2021-07-24 NOTE — ED Triage Notes (Signed)
Patient BIB [facility] for left sided chest pain that is exacerbated by lifting movement, pain is reproducible with palpation. Received 324mg  aspirin PTA. Reports working in heavy objects regular, reports improvement in pain after taking aspirin. Patient alert, oriented, and in no apparent distress at this time.

## 2021-07-24 NOTE — ED Provider Notes (Signed)
Emergency Medicine Provider Triage Evaluation Note  Tammy Welch , a 30 y.o. female  was evaluated in triage.  Pt complains of left-sided chest pain onset 6:40 AM this morning.  Patient has received aspirin prior to arrival.  Pain occurs with movement, pain does not radiate.  Denies similar pain in the past.  Patient reports she is otherwise healthy no daily medication use.  Denies recent surgery/immobilization, history of blood clot, extremity swelling, history of cancer, exogenous hormone use or any additional concerns that.  Review of Systems  Positive: Chest pain Negative: Cough, shortness of breath, fever, chills, history of blood clot, extremity swelling, history of cancer, exogenous hormone use, recent surgery/immobilization, abdominal pain, nausea, vomiting or any additional concerns.  Physical Exam  BP 110/69 (BP Location: Left Arm)   Pulse 83   Resp 18   SpO2 99%  Gen:   Awake, no distress   Resp:  Normal effort  MSK:   Moves extremities without difficulty, no edema Other:  Distal pulses intact x4.  Cardiopulmonary exam within normal notes.  Medical Decision Making  Medically screening exam initiated at 8:20 AM.  Appropriate orders placed.  Tammy Welch was informed that the remainder of the evaluation will be completed by another provider, this initial triage assessment does not replace that evaluation, and the importance of remaining in the ED until their evaluation is complete.   Note: Portions of this report may have been transcribed using voice recognition software. Every effort was made to ensure accuracy; however, inadvertent computerized transcription errors may still be present.    Elizabeth Palau 07/24/21 3810    Wynetta Fines, MD 07/24/21 [phone]

## 2021-07-24 NOTE — ED Provider Notes (Signed)
[facility] Provider Note   CSN: [id number] Arrival date & time: 07/24/21  0747     History Chief Complaint  Patient presents with   Chest Pain    Tammy [facility] is a 30 y.o. female.  30 year old female with prior medical history as detailed below presents for evaluation.  Patient complains of left-sided chest discomfort.  Patient denies any specific inciting event.  Patient reports increased pain with movement of the left shoulder or left chest wall.  She reports tenderness over the area itself.  She denies any significant trauma such as a fall.  She denies associated nausea, vomiting, shortness of breath, or other complaint.  She has not taken anything at home for her symptoms.  The history is provided by the patient.  Chest Pain Pain location:  L chest Pain quality: aching   Pain radiates to:  Does not radiate Pain severity:  Mild Onset quality:  Gradual Duration:  12 hours Timing:  Constant Progression:  Waxing and waning Chronicity:  New     Past Medical History:  Diagnosis Date   Anxiety    no meds   Boil of buttock    recurrent   Chlamydia 03/31/2015   Treated, TOC 04/20/15   Depression    History of marijuana use 03/25/2014   SVD (spontaneous vaginal delivery) 05/01/2015    Patient Active Problem List   Diagnosis Date Noted   SVD (spontaneous vaginal delivery) 05/01/2015   PROM (premature rupture of membranes) 04/30/2015    No past surgical history on file.   OB History     Gravida  1   Para  1   Term  1   Preterm      AB      Living  1      SAB      IAB      Ectopic      Multiple  0   Live Births  1           Family History  Problem Relation Age of Onset   Hypertension Other    Hypertension Mother     Social History   Tobacco Use   Smoking status: Never   Smokeless tobacco: Never  Vaping Use   Vaping Use: Never used  Substance Use Topics   Alcohol use: Yes    Comment:  liquor and beer last week   Drug use: Yes    Types: Marijuana    Home Medications Prior to Admission medications   Medication Sig Start Date End Date Taking? Authorizing Provider  promethazine (PHENERGAN) 25 MG tablet Take 1 tablet (25 mg total) by mouth every 6 (six) hours as needed for nausea or vomiting. 04/19/21   Antony Madura, PA-C    Allergies    Patient has no known allergies.  Review of Systems   Review of Systems  Cardiovascular:  Positive for chest pain.  All other systems reviewed and are negative.  Physical Exam Updated Vital Signs BP 101/65 (BP Location: Left Arm)   Pulse 62   Temp 98 F (36.7 C)   Resp 18   SpO2 100%   Physical Exam Vitals and nursing note reviewed.  Constitutional:      General: She is not in acute distress.    Appearance: Normal appearance. She is well-developed.  HENT:     Head: Normocephalic and atraumatic.  Eyes:     Conjunctiva/sclera: Conjunctivae normal.     Pupils: Pupils are equal,  round, and reactive to light.  Cardiovascular:     Rate and Rhythm: Normal rate and regular rhythm.     Heart sounds: Normal heart sounds.  Pulmonary:     Effort: Pulmonary effort is normal. No respiratory distress.     Breath sounds: Normal breath sounds.  Chest:     Comments: Mild diffuse tenderness to palpation over the left anterior chest wall.  Patient's pain is reproduced with palpation. Abdominal:     General: There is no distension.     Palpations: Abdomen is soft.     Tenderness: There is no abdominal tenderness.  Musculoskeletal:        General: No deformity. Normal range of motion.     Cervical back: Normal range of motion and neck supple.  Skin:    General: Skin is warm and dry.  Neurological:     General: No focal deficit present.     Mental Status: She is alert and oriented to person, place, and time.    ED Results / Procedures / Treatments   Labs (all labs ordered are listed, but only abnormal results are displayed) Labs  Reviewed  BASIC METABOLIC PANEL  CBC  I-STAT BETA HCG BLOOD, ED ([facility], [facility], [facility] ONLY)  TROPONIN I (HIGH SENSITIVITY)  TROPONIN I (HIGH SENSITIVITY)    EKG None  Radiology DG Chest 2 View  Result Date: 07/24/2021 CLINICAL DATA:  Chest pain, exacerbated by lifting. EXAM: CHEST - 2 VIEW COMPARISON:  07/08/2019 FINDINGS: Normal heart, mediastinum and hila. Clear lungs.  No pleural effusion or pneumothorax. Skeletal structures are unremarkable. IMPRESSION: Normal chest radiographs. Electronically Signed   By: Amie Portland M.D.   On: 07/24/2021 08:20    Procedures Procedures   Medications Ordered in ED Medications  ketorolac (TORADOL) 15 MG/ML injection 15 mg (15 mg Intramuscular Given 07/24/21 1306)    ED Course  I have reviewed the triage vital signs and the nursing notes.  Pertinent labs & imaging results that were available during my care of the patient were reviewed by me and considered in my medical decision making (see chart for details).    MDM Rules/Calculators/A&P                           MDM  MSE complete  Shaquandra A Hakala was evaluated in Emergency Department on 07/24/2021 for the symptoms described in the history of present illness. She was evaluated in the context of the global COVID-19 pandemic, which necessitated consideration that the patient might be at risk for infection with the SARS-CoV-2 virus that causes COVID-19. Institutional protocols and algorithms that pertain to the evaluation of patients at risk for COVID-19 are in a state of rapid change based on information released by regulatory bodies including the CDC and federal and state organizations. These policies and algorithms were followed during the patient's care in the ED.  Patient is presenting with complaint of left-sided anterior chest wall pain.  Described pain is not consistent with likely ACS.  Patient's symptoms and presentation are consistent with likely muscular strain.  Patient's pain is  reproduced completely with palpation of the affected area.  Labs obtained are without significant abnormality.  Patient does feel improved after her ED evaluation.  She does understand need for close out patient follow-up.  She does understand the appropriate management of her symptoms at home. Final Clinical Impression(s) / ED Diagnoses Final diagnoses:  Chest wall pain    Rx / DC  Orders ED Discharge Orders     None        Wynetta Fines, MD 07/24/21 1310

## 2021-07-24 NOTE — Discharge Instructions (Addendum)
Return for any problem.   Use ibuprofen --600 mg taken every 8 hours --as needed for muscular pain.

## 2022-01-15 ENCOUNTER — Encounter (HOSPITAL_COMMUNITY): Payer: Self-pay | Admitting: Emergency Medicine

## 2022-01-15 ENCOUNTER — Other Ambulatory Visit: Payer: Self-pay

## 2022-01-15 ENCOUNTER — Emergency Department (HOSPITAL_COMMUNITY)
Admission: EM | Admit: 2022-01-15 | Discharge: 2022-01-15 | Disposition: A | Discharge status: Home / Self Care | Payer: Managed Care, Other (non HMO) | Attending: Emergency Medicine | Admitting: Emergency Medicine

## 2022-01-15 DIAGNOSIS — Z20822 Contact with and (suspected) exposure to covid-19: Secondary | ICD-10-CM | POA: Diagnosis not present

## 2022-01-15 DIAGNOSIS — J020 Streptococcal pharyngitis: Secondary | ICD-10-CM | POA: Diagnosis not present

## 2022-01-15 DIAGNOSIS — J029 Acute pharyngitis, unspecified: Secondary | ICD-10-CM | POA: Diagnosis present

## 2022-01-15 LAB — RESP PANEL BY RT-PCR (FLU A&B, COVID) ARPGX2
Influenza A by PCR: NEGATIVE
Influenza B by PCR: NEGATIVE
SARS Coronavirus 2 by RT PCR: NEGATIVE

## 2022-01-15 LAB — GROUP A STREP BY PCR: Group A Strep by PCR: DETECTED — AB

## 2022-01-15 MED ORDER — LIDOCAINE VISCOUS HCL 2 % MT SOLN: 15.0000 mL | Freq: Four times a day (QID) | OROMUCOSAL | 0 refills | Status: DC | PRN

## 2022-01-15 MED ORDER — LIDOCAINE VISCOUS HCL 2 % MT SOLN
15.0000 mL | Freq: Once | OROMUCOSAL | Status: AC
  Administered 2022-01-15: 15 mL via OROMUCOSAL
  Filled 2022-01-15: qty 15

## 2022-01-15 MED ORDER — PENICILLIN G BENZATHINE 1200000 UNIT/2ML IM SUSY
1.2000 10*6.[IU] | PREFILLED_SYRINGE | Freq: Once | INTRAMUSCULAR | Status: AC
  Administered 2022-01-15: 1.2 10*6.[IU] via INTRAMUSCULAR
  Filled 2022-01-15: qty 2

## 2022-01-15 NOTE — ED Triage Notes (Signed)
Pt reported to ED with c/o sore throat since Sunday. Pt also reports cold sweats and body aches. Denies any shortness of breath, chest pain, cough, nausea, vomiting or diarrheal.  ?

## 2022-01-15 NOTE — ED Provider Triage Note (Signed)
Emergency Medicine Provider Triage Evaluation Note ? ?Tam[facility] , a 31 y.o. female  was evaluated in triage.  Pt complains of sore throat, body aches, congestion, and chills. Ongoing for two days. No fevers or cough.  ? ?Review of Systems  ?Positive:  ?Negative:  ? ?Physical Exam  ?BP 112/73 (BP Location: Right Arm)   Pulse 82   Temp 98.4 ?F (36.9 ?C) (Oral)   Resp 16   SpO2 100%  ?Gen:   Awake, no distress   ?Resp:  Normal effort  ?MSK:   Moves extremities without difficulty  ?Other:  No evidence of PTA or tonsillar swelling, no exudates ? ?Medical Decision Making  ?Medically screening exam initiated at 8:53 PM.  Appropriate orders placed.  [facility] was informed that the remainder of the evaluation will be completed by another provider, this initial triage assessment does not replace that evaluation, and the importance of remaining in the ED until their evaluation is complete. ? ? ?  ?Claudie Leach, PA-C ?01/15/22 2054 ? ?

## 2022-01-15 NOTE — ED Provider Notes (Signed)
?[facility] EMERGENCY DEPARTMENT ?Provider Note ? ? ?CSN: [id number] ?Arrival date & time: 01/15/22  2035 ? ?  ? ?History ? ?Chief Complaint  ?Patient presents with  ? Sore Throat  ? ? ?Tammy [facility] is a 31 y.o. female with past medical history of depression.  Presents emergency department with a chief complaint of sore throat.  Patient reports that sore throat started on Sunday and has been constant since then.  Patient has been trying not to use and cough drops with no relief of her symptoms.  Pain is worse with p.o. intake.  Additionally patient endorses generalized body aches, congestion and chills.  Patient denies any known sick contacts. ? ?Denies any fevers, facial swelling, drooling, trismus, difficulty swallowing, high potato voice, shortness of breath, cough, abdominal pain, nausea, vomiting, diarrhea, dysuria, hematuria, urinary urgency, vaginal pain, vaginal bleeding, vaginal discharge. ? ? ?Sore Throat ?Pertinent negatives include no chest pain, no abdominal pain, no headaches and no shortness of breath.  ? ?  ? ?Home Medications ?Prior to Admission medications   ?Medication Sig Start Date End Date Taking? Authorizing Provider  ?promethazine (PHENERGAN) 25 MG tablet Take 1 tablet (25 mg total) by mouth every 6 (six) hours as needed for nausea or vomiting. 04/19/21   Antony Madura, PA-C  ?   ? ?Allergies    ?Patient has no known allergies.   ? ?Review of Systems   ?Review of Systems  ?Constitutional:  Positive for chills. Negative for fever.  ?HENT:  Positive for congestion and sore throat. Negative for drooling, facial swelling, rhinorrhea, sinus pressure, sinus pain, sneezing and trouble swallowing.   ?Eyes:  Negative for visual disturbance.  ?Respiratory:  Negative for cough and shortness of breath.   ?Cardiovascular:  Negative for chest pain.  ?Gastrointestinal:  Negative for abdominal pain, diarrhea, nausea and vomiting.  ?Genitourinary:  Negative for difficulty urinating,  dysuria, frequency, hematuria, urgency, vaginal bleeding, vaginal discharge and vaginal pain.  ?Musculoskeletal:  Positive for myalgias. Negative for back pain and neck pain.  ?Skin:  Negative for color change and rash.  ?Neurological:  Negative for dizziness, syncope, light-headedness and headaches.  ?Psychiatric/Behavioral:  Negative for confusion.   ? ?Physical Exam ?Updated Vital Signs ?BP 112/73 (BP Location: Right Arm)   Pulse 82   Temp 98.4 ?F (36.9 ?C) (Oral)   Resp 16   SpO2 100%  ?Physical Exam ?Vitals and nursing note reviewed.  ?Constitutional:   ?   General: She is not in acute distress. ?   Appearance: She is not ill-appearing, toxic-appearing or diaphoretic.  ?HENT:  ?   Head: Normocephalic.  ?   Jaw: No trismus, tenderness, swelling, pain on movement or malocclusion.  ?   Salivary Glands: Right salivary gland is not diffusely enlarged or tender. Left salivary gland is not diffusely enlarged or tender.  ?   Comments: No facial swelling ?   Mouth/Throat:  ?   Lips: Pink. No lesions.  ?   Mouth: Mucous membranes are moist.  ?   Tongue: No lesions. Tongue does not deviate from midline.  ?   Palate: No mass and lesions.  ?   Pharynx: Oropharynx is clear. Uvula midline. No pharyngeal swelling, oropharyngeal exudate, posterior oropharyngeal erythema or uvula swelling.  ?   Tonsils: Tonsillar exudate present. No tonsillar abscesses. 2+ on the right. 2+ on the left.  ?   Comments: Exudate noted to right tonsil.  Tonsils erythematous bilaterally.  Handles oral questions without difficulty ?Eyes:  ?  General: No scleral icterus.    ?   Right eye: No discharge.     ?   Left eye: No discharge.  ?Neck:  ?   Comments: No submandibular swelling ?Cardiovascular:  ?   Rate and Rhythm: Normal rate.  ?Pulmonary:  ?   Effort: Pulmonary effort is normal. No tachypnea or bradypnea.  ?   Breath sounds: Normal breath sounds. No stridor or decreased air movement.  ?   Comments: Speaks in full complete sentences without  difficulty ?Musculoskeletal:  ?   Cervical back: Full passive range of motion without pain, normal range of motion and neck supple. No edema, erythema, signs of trauma, rigidity, torticollis or crepitus. No pain with movement, spinous process tenderness or muscular tenderness. Normal range of motion.  ?Lymphadenopathy:  ?   Cervical: No cervical adenopathy.  ?Skin: ?   General: Skin is warm and dry.  ?Neurological:  ?   General: No focal deficit present.  ?   Mental Status: She is alert.  ?   GCS: GCS eye subscore is 4. GCS verbal subscore is 5. GCS motor subscore is 6.  ?Psychiatric:     ?   Behavior: Behavior is cooperative.  ? ? ?ED Results / Procedures / Treatments   ?Labs ?(all labs ordered are listed, but only abnormal results are displayed) ?Labs Reviewed  ?GROUP A STREP BY PCR - Abnormal; Notable for the following components:  ?    Result Value  ? Group A Strep by PCR DETECTED (*)   ? All other components within normal limits  ?RESP PANEL BY RT-PCR (FLU A&B, COVID) [id number]  ? ? ?EKG ?None ? ?Radiology ?No results found. ? ?Procedures ?Procedures  ? ? ?Medications Ordered in ED ?Medications - No data to display ? ?ED Course/ Medical Decision Making/ A&P ?  ?                        ?Medical Decision Making ?Risk ?Prescription drug management. ? ? ?Alert 31 year old female no acute distress, nontoxic-appearing.  Presents emergency department with chief complaint of sore throat. ? ?Information is obtained from patient.  Past medical records reviewed including previous provider notes.  Patient has medical history as outlined in HPI which complicates her care. ? ?Due to patient's reports of flulike illness respiratory panel and strep PCR panel was obtained while patient was in triage.  I personally viewed and interpreted these results.  Patient is negative for COVID-19 and influenza.  Positive for strep pharyngitis. ? ?On physical exam patient has no trismus, drooling, high potato voice, facial swelling,  semitubular swelling, pain with passive range of motion of neck.  Low suspicion for deep space neck infection and therefore CT imaging was not ordered.  Patient has no signs of peritonsillar abscess. ? ?Patient to receive penicillin IM injection in the emergency department.  We will send patient home with prescription for viscous lidocaine.  Discussed symptomatic treatment.  Patient to follow-up with PCP if symptoms not improved. ? ?Discussed results, findings, treatment and follow up. Patient advised of return precautions. Patient verbalized understanding and agreed with plan. ? ? ? ? ? ? ? ? ?Final Clinical Impression(s) / ED Diagnoses ?Final diagnoses:  ?Strep pharyngitis  ? ? ?Rx / DC Orders ?ED Discharge Orders   ? ?      Ordered  ?  lidocaine (XYLOCAINE) 2 % solution  Every 6 hours PRN       ? 01/15/22 2325  ? ?  ?  ? ?  ? ? ?  ?  Haskel Schroeder, PA-C ?01/16/22 1709 ? ?  ?Gloris Manchester, MD ?01/17/22 [phone] ? ?

## 2022-01-15 NOTE — Discharge Instructions (Signed)
You came to the emergency department today to be evaluated for your sore throat.  You were positive for strep pharyngitis.  Due to this you received a shot of antibiotics in the emergency department.  I have given you prescription for viscous lidocaine.  You may take this every 6 hours as needed to help with your sore throat.  Additionally can alternate Tylenol and ibuprofen as outlined below to help with your sore throat.   ? ?Please take Ibuprofen (Advil, motrin) and Tylenol (acetaminophen) to relieve your pain.   ? ?You may take up to 600 MG (3 pills) of normal strength ibuprofen every 8 hours as needed.   ?You make take tylenol, up to 1,000 mg (two extra strength pills) every 8 hours as needed.  ? ?It is safe to take ibuprofen and tylenol at the same time as they work differently.  ? Do not take more than 3,000 mg tylenol in a 24 hour period (not more than one dose every 8 hours.  Please check all medication labels as many medications such as pain and cold medications may contain tylenol.  Do not drink alcohol while taking these medications.  Do not take other NSAID'S while taking ibuprofen (such as aleve or naproxen).  Please take ibuprofen with food to decrease stomach upset. ? ?Get help right away if: ?You have new symptoms, such as vomiting, severe headache, stiff or painful neck, chest pain, or shortness of breath. ?You have severe throat pain, drooling, or changes in your voice. ?You have swelling of the neck, or the skin on the neck becomes red and tender. ?You have signs of dehydration, such as tiredness (fatigue), dry mouth, and decreased urination. ?You become increasingly sleepy, or you cannot wake up completely. ?Your joints become red or painful. ?

## 2022-06-12 ENCOUNTER — Encounter (HOSPITAL_BASED_OUTPATIENT_CLINIC_OR_DEPARTMENT_OTHER): Payer: Self-pay

## 2022-06-12 ENCOUNTER — Emergency Department (HOSPITAL_BASED_OUTPATIENT_CLINIC_OR_DEPARTMENT_OTHER)
Admission: EM | Admit: 2022-06-12 | Discharge: 2022-06-12 | Disposition: A | Discharge status: Home / Self Care | Payer: Managed Care, Other (non HMO) | Attending: Emergency Medicine | Admitting: Emergency Medicine

## 2022-06-12 ENCOUNTER — Other Ambulatory Visit: Payer: Self-pay

## 2022-06-12 DIAGNOSIS — R0602 Shortness of breath: Secondary | ICD-10-CM | POA: Insufficient documentation

## 2022-06-12 DIAGNOSIS — R002 Palpitations: Secondary | ICD-10-CM | POA: Insufficient documentation

## 2022-06-12 DIAGNOSIS — R0789 Other chest pain: Secondary | ICD-10-CM | POA: Insufficient documentation

## 2022-06-12 DIAGNOSIS — F410 Panic disorder [episodic paroxysmal anxiety] without agoraphobia: Secondary | ICD-10-CM | POA: Diagnosis present

## 2022-06-12 LAB — CBG MONITORING, ED: Glucose-Capillary: 119 mg/dL — ABNORMAL HIGH (ref 70–99)

## 2022-06-12 MED ORDER — LORAZEPAM 1 MG PO TABS
1.0000 mg | ORAL_TABLET | Freq: Once | ORAL | Status: AC
  Administered 2022-06-12: 1 mg via ORAL
  Filled 2022-06-12: qty 1

## 2022-06-12 NOTE — Discharge Instructions (Addendum)
Recommend you follow-up with your PCP as needed. Symptoms are consistent with a panic attack.

## 2022-06-12 NOTE — ED Provider Notes (Signed)
  [facility] EMERGENCY DEPT Provider Note   CSN: [id number] Arrival date & time: 06/12/22  1956     History {Add pertinent medical, surgical, social history, OB history to HPI:1} Chief Complaint  Patient presents with   Panic Attack    Tammy Welch is a 31 y.o. female.  HPI     Home Medications Prior to Admission medications   Medication Sig Start Date End Date Taking? Authorizing Provider  lidocaine (XYLOCAINE) 2 % solution Use as directed 15 mLs in the mouth or throat every 6 (six) hours as needed for mouth pain. 01/15/22   Haskel Schroeder, PA-C  promethazine (PHENERGAN) 25 MG tablet Take 1 tablet (25 mg total) by mouth every 6 (six) hours as needed for nausea or vomiting. 04/19/21   Antony Madura, PA-C      Allergies    Patient has no known allergies.    Review of Systems   Review of Systems  Physical Exam Updated Vital Signs BP (!) 119/90 (BP Location: Left Arm)   Pulse 72   Temp 98.5 F (36.9 C)   Resp (!) 22   Ht 5\' 2"  (1.575 m)   Wt 73.9 kg   SpO2 100%   BMI 29.80 kg/m  Physical Exam  ED Results / Procedures / Treatments   Labs (all labs ordered are listed, but only abnormal results are displayed) Labs Reviewed - No data to display  EKG EKG Interpretation  Date/Time:  Wednesday June 12 2022 20:09:55 EDT Ventricular Rate:  74 PR Interval:  156 QRS Duration: 74 QT Interval:  372 QTC Calculation: 412 R Axis:   69 Text Interpretation: Normal sinus rhythm Normal ECG When compared with ECG of 24-Jul-2021 07:51, No significant change was found Confirmed by 26-Jul-2021 ([id number]) on 06/12/2022 8:13:14 PM  Radiology No results found.  Procedures Procedures  {Document cardiac monitor, telemetry assessment procedure when appropriate:1}  Medications Ordered in ED Medications - No data to display  ED Course/ Medical Decision Making/ A&P                           Medical Decision Making  ***  {Document critical care time  when appropriate:1} {Document review of labs and clinical decision tools ie heart score, Chads2Vasc2 etc:1}  {Document your independent review of radiology images, and any outside records:1} {Document your discussion with family members, caretakers, and with consultants:1} {Document social determinants of health affecting pt's care:1} {Document your decision making why or why not admission, treatments were needed:1} Final Clinical Impression(s) / ED Diagnoses Final diagnoses:  None    Rx / DC Orders ED Discharge Orders     None

## 2022-06-12 NOTE — ED Triage Notes (Signed)
Patient BIB [facility] from Work.  Endorses being at Work today when her [employer] was overwhelming the Patient with her Workload.   States she had a Numbness Sensation overwhelm her Body and EMS was contacted.   History of Anxiety but is currently untreated as Patient was told when obtaining her refill that she required a Re-Evaluation.   NAD Noted during Triage. A&Ox4. GCS 15. BIB Stretcher/Wheelchair.

## 2022-09-14 ENCOUNTER — Emergency Department (HOSPITAL_BASED_OUTPATIENT_CLINIC_OR_DEPARTMENT_OTHER)
Admission: EM | Admit: 2022-09-14 | Discharge: 2022-09-14 | Disposition: A | Discharge status: Home / Self Care | Payer: Managed Care, Other (non HMO) | Attending: Emergency Medicine | Admitting: Emergency Medicine

## 2022-09-14 ENCOUNTER — Encounter (HOSPITAL_BASED_OUTPATIENT_CLINIC_OR_DEPARTMENT_OTHER): Payer: Self-pay | Admitting: Emergency Medicine

## 2022-09-14 ENCOUNTER — Other Ambulatory Visit: Payer: Self-pay

## 2022-09-14 DIAGNOSIS — J029 Acute pharyngitis, unspecified: Secondary | ICD-10-CM | POA: Insufficient documentation

## 2022-09-14 DIAGNOSIS — R059 Cough, unspecified: Secondary | ICD-10-CM | POA: Diagnosis present

## 2022-09-14 DIAGNOSIS — Z20822 Contact with and (suspected) exposure to covid-19: Secondary | ICD-10-CM | POA: Diagnosis not present

## 2022-09-14 LAB — GROUP A STREP BY PCR: Group A Strep by PCR: NOT DETECTED

## 2022-09-14 LAB — SARS CORONAVIRUS 2 BY RT PCR: SARS Coronavirus 2 by RT PCR: NEGATIVE

## 2022-09-14 MED ORDER — IBUPROFEN 100 MG/5ML PO SUSP
400.0000 mg | Freq: Once | ORAL | Status: AC
  Administered 2022-09-14: 400 mg via ORAL
  Filled 2022-09-14: qty 20

## 2022-09-14 MED ORDER — LIDOCAINE VISCOUS HCL 2 % MT SOLN
15.0000 mL | Freq: Once | OROMUCOSAL | Status: AC
  Administered 2022-09-14: 15 mL via OROMUCOSAL
  Filled 2022-09-14: qty 15

## 2022-09-14 MED ORDER — ACETAMINOPHEN 160 MG/5ML PO SOLN
650.0000 mg | Freq: Once | ORAL | Status: AC
  Administered 2022-09-14: 650 mg via ORAL
  Filled 2022-09-14: qty 20.3

## 2022-09-14 NOTE — ED Triage Notes (Addendum)
Cough congestion sore throat started Thursday.  Reports having suicidal ideation with plan and attempt in February.

## 2022-09-14 NOTE — ED Notes (Signed)
Pt now denying that she is having suicidal thoughts.

## 2022-09-14 NOTE — ED Provider Notes (Signed)
[facility] EMERGENCY DEPT Provider Note   CSN: [id number] Arrival date & time: 09/14/22  0250     History  Chief Complaint  Patient presents with   Cough   SI    Tammy Welch is a 31 y.o. female.  The history is provided by the patient.  URI Presenting symptoms: congestion, cough, rhinorrhea and sore throat   Presenting symptoms: no fever   Severity:  Moderate Onset quality:  Gradual Duration:  2 days Timing:  Constant Progression:  Unchanged Chronicity:  New Relieved by:  Nothing Worsened by:  Nothing Ineffective treatments:  None tried Associated symptoms: no neck pain and no wheezing   Risk factors: not elderly        Home Medications Prior to Admission medications   Medication Sig Start Date End Date Taking? Authorizing Provider  lidocaine (XYLOCAINE) 2 % solution Use as directed 15 mLs in the mouth or throat every 6 (six) hours as needed for mouth pain. 01/15/22   Haskel Schroeder, PA-C  promethazine (PHENERGAN) 25 MG tablet Take 1 tablet (25 mg total) by mouth every 6 (six) hours as needed for nausea or vomiting. 04/19/21   Antony Madura, PA-C      Allergies    Patient has no known allergies.    Review of Systems   Review of Systems  Constitutional:  Negative for fever.  HENT:  Positive for congestion, rhinorrhea and sore throat.   Eyes:  Negative for photophobia.  Respiratory:  Positive for cough. Negative for wheezing.   Gastrointestinal:  Negative for vomiting.  Musculoskeletal:  Negative for neck pain and neck stiffness.  Psychiatric/Behavioral:  Negative for hallucinations, self-injury and suicidal ideas. The patient is not nervous/anxious.   All other systems reviewed and are negative.   Physical Exam Updated Vital Signs BP 115/77 (BP Location: Right Arm)   Pulse 94   Temp 98.9 F (37.2 C) (Oral)   Resp 18   LMP 08/22/2022   SpO2 100%  Physical Exam Constitutional:      General: She is not in acute distress.     Appearance: Normal appearance. She is well-developed.  HENT:     Head: Normocephalic and atraumatic.     Nose: Congestion and rhinorrhea present.     Mouth/Throat:     Mouth: Mucous membranes are moist.     Pharynx: Oropharynx is clear. No oropharyngeal exudate or posterior oropharyngeal erythema.     Comments: Intact phonation, no pain with displacement of the larynx  Eyes:     Pupils: Pupils are equal, round, and reactive to light.  Cardiovascular:     Rate and Rhythm: Normal rate and regular rhythm.     Pulses: Normal pulses.     Heart sounds: Normal heart sounds.  Pulmonary:     Effort: Pulmonary effort is normal. No respiratory distress.     Breath sounds: Normal breath sounds.  Abdominal:     General: Bowel sounds are normal. There is no distension.     Palpations: Abdomen is soft.     Tenderness: There is no abdominal tenderness. There is no guarding or rebound.  Genitourinary:    Vagina: No vaginal discharge.  Musculoskeletal:        General: Normal range of motion.     Cervical back: Neck supple. No rigidity.  Lymphadenopathy:     Cervical: No cervical adenopathy.  Skin:    General: Skin is dry.     Capillary Refill: Capillary refill takes less than 2 seconds.  Findings: No erythema or rash.  Neurological:     General: No focal deficit present.     Mental Status: She is alert and oriented to person, place, and time.     Deep Tendon Reflexes: Reflexes normal.  Psychiatric:        Mood and Affect: Mood normal. Affect is not labile, blunt or flat.        Behavior: Behavior normal.        Thought Content: Thought content does not include homicidal or suicidal ideation. Thought content does not include homicidal or suicidal plan.     ED Results / Procedures / Treatments   Labs (all labs ordered are listed, but only abnormal results are displayed) Labs Reviewed  SARS CORONAVIRUS 2 BY RT PCR  GROUP A STREP BY PCR    EKG None  Radiology No results  found.  Procedures Procedures    Medications Ordered in ED Medications  acetaminophen (TYLENOL) 160 MG/5ML solution 650 mg (has no administration in time range)  ibuprofen (ADVIL) 100 MG/5ML suspension 400 mg (has no administration in time range)  lidocaine (XYLOCAINE) 2 % viscous mouth solution 15 mL (has no administration in time range)    ED Course/ Medical Decision Making/ A&P                           Medical Decision Making URI x 2 days  Problems Addressed: Viral pharyngitis:    Details: Gargle with salt water, alternate tylenol and ibuprofen   Amount and/or Complexity of Data Reviewed Labs: ordered.    Details: Negative covid and flu and strep   Risk OTC drugs. Prescription drug management. Risk Details: I have seen and appreciate safety questions as part of triage.  Patient is actively denying suicidality.  She states she is just here for her throat and respiratory symptoms.  She does not want to take her life "I would never go through with suicide. I see a therapist on a regular basis."  I have offered labs and TTS in the ED and she again states she is not suicidal and does not need or want to see a counselor.  I have advised [facility] as a resourse if she should ever feel worse and have given strict ED return precautions..  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      Final Clinical Impression(s) / ED Diagnoses Final diagnoses:  Viral pharyngitis   Return for intractable cough, coughing up blood, fevers > 100.4 unrelieved by medication, shortness of breath, intractable vomiting, chest pain, shortness of breath, weakness, numbness, changes in speech, facial asymmetry, abdominal pain, passing out, Inability to tolerate liquids or food, cough, altered mental status or any concerns. No signs of systemic illness or infection. The patient is nontoxic-appearing on exam and vital signs are within normal limits.  I have reviewed the triage vital signs and the nursing notes.  Pertinent labs & imaging results that were available during my care of the patient were reviewed by me and considered in my medical decision making (see chart for details). After history, exam, and medical workup I feel the patient has been appropriately medically screened and is safe for discharge home. Pertinent diagnoses were discussed with the patient. Patient was given return precautions.  Rx / DC Orders ED Discharge Orders     None         Bradie Sangiovanni, MD 09/14/22 0401

## 2022-10-16 ENCOUNTER — Other Ambulatory Visit: Payer: Self-pay

## 2022-10-16 ENCOUNTER — Emergency Department (HOSPITAL_BASED_OUTPATIENT_CLINIC_OR_DEPARTMENT_OTHER)
Admission: EM | Admit: 2022-10-16 | Discharge: 2022-10-16 | Disposition: A | Discharge status: Home / Self Care | Payer: Managed Care, Other (non HMO) | Attending: Emergency Medicine | Admitting: Emergency Medicine

## 2022-10-16 ENCOUNTER — Encounter (HOSPITAL_BASED_OUTPATIENT_CLINIC_OR_DEPARTMENT_OTHER): Payer: Self-pay | Admitting: Emergency Medicine

## 2022-10-16 DIAGNOSIS — Z20822 Contact with and (suspected) exposure to covid-19: Secondary | ICD-10-CM | POA: Insufficient documentation

## 2022-10-16 DIAGNOSIS — R059 Cough, unspecified: Secondary | ICD-10-CM | POA: Diagnosis present

## 2022-10-16 DIAGNOSIS — J111 Influenza due to unidentified influenza virus with other respiratory manifestations: Secondary | ICD-10-CM

## 2022-10-16 DIAGNOSIS — J101 Influenza due to other identified influenza virus with other respiratory manifestations: Secondary | ICD-10-CM | POA: Diagnosis not present

## 2022-10-16 LAB — RESP PANEL BY RT-PCR (RSV, FLU A&B, COVID)  RVPGX2
Influenza A by PCR: NEGATIVE
Influenza B by PCR: POSITIVE — AB
Resp Syncytial Virus by PCR: NEGATIVE
SARS Coronavirus 2 by RT PCR: NEGATIVE

## 2022-10-16 NOTE — Discharge Instructions (Addendum)
You tested positive for influenza B today.  You can take Mucinex or TheraFlu for symptom relief.  Gargle with salt water, stay hydrated. I recommend close follow-up with PCP for reevaluation.  Please do not hesitate to return to emergency department if worrisome signs symptoms we discussed become apparent.

## 2022-10-16 NOTE — ED Triage Notes (Signed)
Pt arrives to ED with c/o cough and body aches.

## 2022-10-16 NOTE — ED Provider Notes (Signed)
[facility] EMERGENCY DEPT Provider Note   CSN: [id number] Arrival date & time: 10/16/22  1124     History  Chief Complaint  Patient presents with   Cough   Generalized Body Aches    Tammy Welch is a 31 y.o. female with no significant past medical history presenting to the emergency department for evaluation of flulike symptoms.  Patient reports she has had cough, nasal congestion, headache for 5 days.  She denies fever, nausea, vomiting.  She reports some chest discomfort with cough and shortness of breath.  No known sick contacts.   Cough     Past Medical History:  Diagnosis Date   Anxiety    no meds   Boil of buttock    recurrent   Chlamydia 03/31/2015   Treated, TOC 04/20/15   Depression    History of marijuana use 03/25/2014   SVD (spontaneous vaginal delivery) 05/01/2015   History reviewed. No pertinent surgical history.   Home Medications Prior to Admission medications   Medication Sig Start Date End Date Taking? Authorizing Provider  lidocaine (XYLOCAINE) 2 % solution Use as directed 15 mLs in the mouth or throat every 6 (six) hours as needed for mouth pain. 01/15/22   Haskel Schroeder, PA-C  promethazine (PHENERGAN) 25 MG tablet Take 1 tablet (25 mg total) by mouth every 6 (six) hours as needed for nausea or vomiting. 04/19/21   Antony Madura, PA-C      Allergies    Patient has no known allergies.    Review of Systems   Review of Systems  Respiratory:  Positive for cough.     Physical Exam Updated Vital Signs BP 117/82   Pulse 94   Temp 98 F (36.7 C) (Temporal)   Resp 14   Ht 5\' 2"  (1.575 m)   Wt 77.1 kg   SpO2 100%   BMI 31.09 kg/m  Physical Exam Vitals and nursing note reviewed.  Constitutional:      Appearance: Normal appearance.  HENT:     Head: Normocephalic and atraumatic.     Mouth/Throat:     Mouth: Mucous membranes are moist.  Eyes:     General: No scleral icterus. Cardiovascular:     Rate and Rhythm:  Normal rate and regular rhythm.     Pulses: Normal pulses.     Heart sounds: Normal heart sounds.  Pulmonary:     Effort: Pulmonary effort is normal.     Breath sounds: Normal breath sounds.  Abdominal:     General: Abdomen is flat.     Palpations: Abdomen is soft.     Tenderness: There is no abdominal tenderness.  Musculoskeletal:        General: No deformity.  Skin:    General: Skin is warm.     Findings: No rash.  Neurological:     General: No focal deficit present.     Mental Status: She is alert.  Psychiatric:        Mood and Affect: Mood normal.     ED Results / Procedures / Treatments   Labs (all labs ordered are listed, but only abnormal results are displayed) Labs Reviewed  RESP PANEL BY RT-PCR (RSV, FLU A&B, COVID)  RVPGX2 - Abnormal; Notable for the following components:      Result Value   Influenza B by PCR POSITIVE (*)    All other components within normal limits    EKG None  Radiology No results found.  Procedures Procedures  Medications Ordered in ED Medications - No data to display  ED Course/ Medical Decision Making/ A&P                           Medical Decision Making  This patient presents to the ED for sore throat, body aches, this involves an extensive number of treatment options, and is a complaint that carries with a high risk of complications and morbidity.  The differential diagnosis includes flu, COVID, RSV, strep, pharyngitis, bronchitis, pneumonia, infectious etiology.  This is not an exhaustive list.  Lab tests: I ordered and personally interpreted labs.  The pertinent results include: Viral panel positive for flu B.  Imaging studies:  Problem list/ ED course/ Critical interventions/ Medical management: HPI: See above Vital signs within normal range and stable throughout visit. Laboratory/imaging studies significant for: See above. On physical examination, patient is afebrile and appears in no acute distress. This patient  presents with symptoms suspicious for flu. Based on history and physical doubt sinusitis. COVID test was negative. Do not suspect underlying cardiopulmonary process. I considered, but think unlikely, pneumonia. Patient is nontoxic appearing and not in need of emergent medical intervention. Patient told to self isolate at home until symptoms subside for 72 hours.  Recommended patient to take TheraFlu or Mucinex for symptom relief.  Follow-up with primary care physician for further evaluation and management.  Return to the ER if new or worsening symptoms. I have reviewed the patient home medicines and have made adjustments as needed.  Cardiac monitoring/EKG: The patient was maintained on a cardiac monitor.  I personally reviewed and interpreted the cardiac monitor which showed an underlying rhythm of: sinus rhythm.  Additional history obtained: External records from outside source obtained and reviewed including: Chart review including previous notes, labs, imaging.  Consultations obtained:  Disposition Continued outpatient therapy. Follow-up with PCP recommended for reevaluation of symptoms. Treatment plan discussed with patient.  Pt acknowledged understanding was agreeable to the plan. Worrisome signs and symptoms were discussed with patient, and patient acknowledged understanding to return to the ED if they noticed these signs and symptoms. Patient was stable upon discharge.   This chart was dictated using voice recognition software.  Despite best efforts to proofread,  errors can occur which can change the documentation meaning.          Final Clinical Impression(s) / ED Diagnoses Final diagnoses:  Flu    Rx / DC Orders ED Discharge Orders     None         Jeanelle Malling, [place] 10/16/22 1817    Jacalyn Lefevre, MD 10/16/22 [phone]

## 2023-01-01 ENCOUNTER — Ambulatory Visit: Payer: Managed Care, Other (non HMO) | Admitting: Orthopaedic Surgery

## 2023-01-24 ENCOUNTER — Ambulatory Visit: Payer: Managed Care, Other (non HMO) | Admitting: Orthopaedic Surgery

## 2023-08-01 IMAGING — CR DG CHEST 2V
2 series · 2 of 2 positions shown · non-contrast
Comparison: 07/08/2019

CLINICAL DATA: Chest pain, exacerbated by lifting.

EXAM:
CHEST - 2 VIEW

[chest pa]
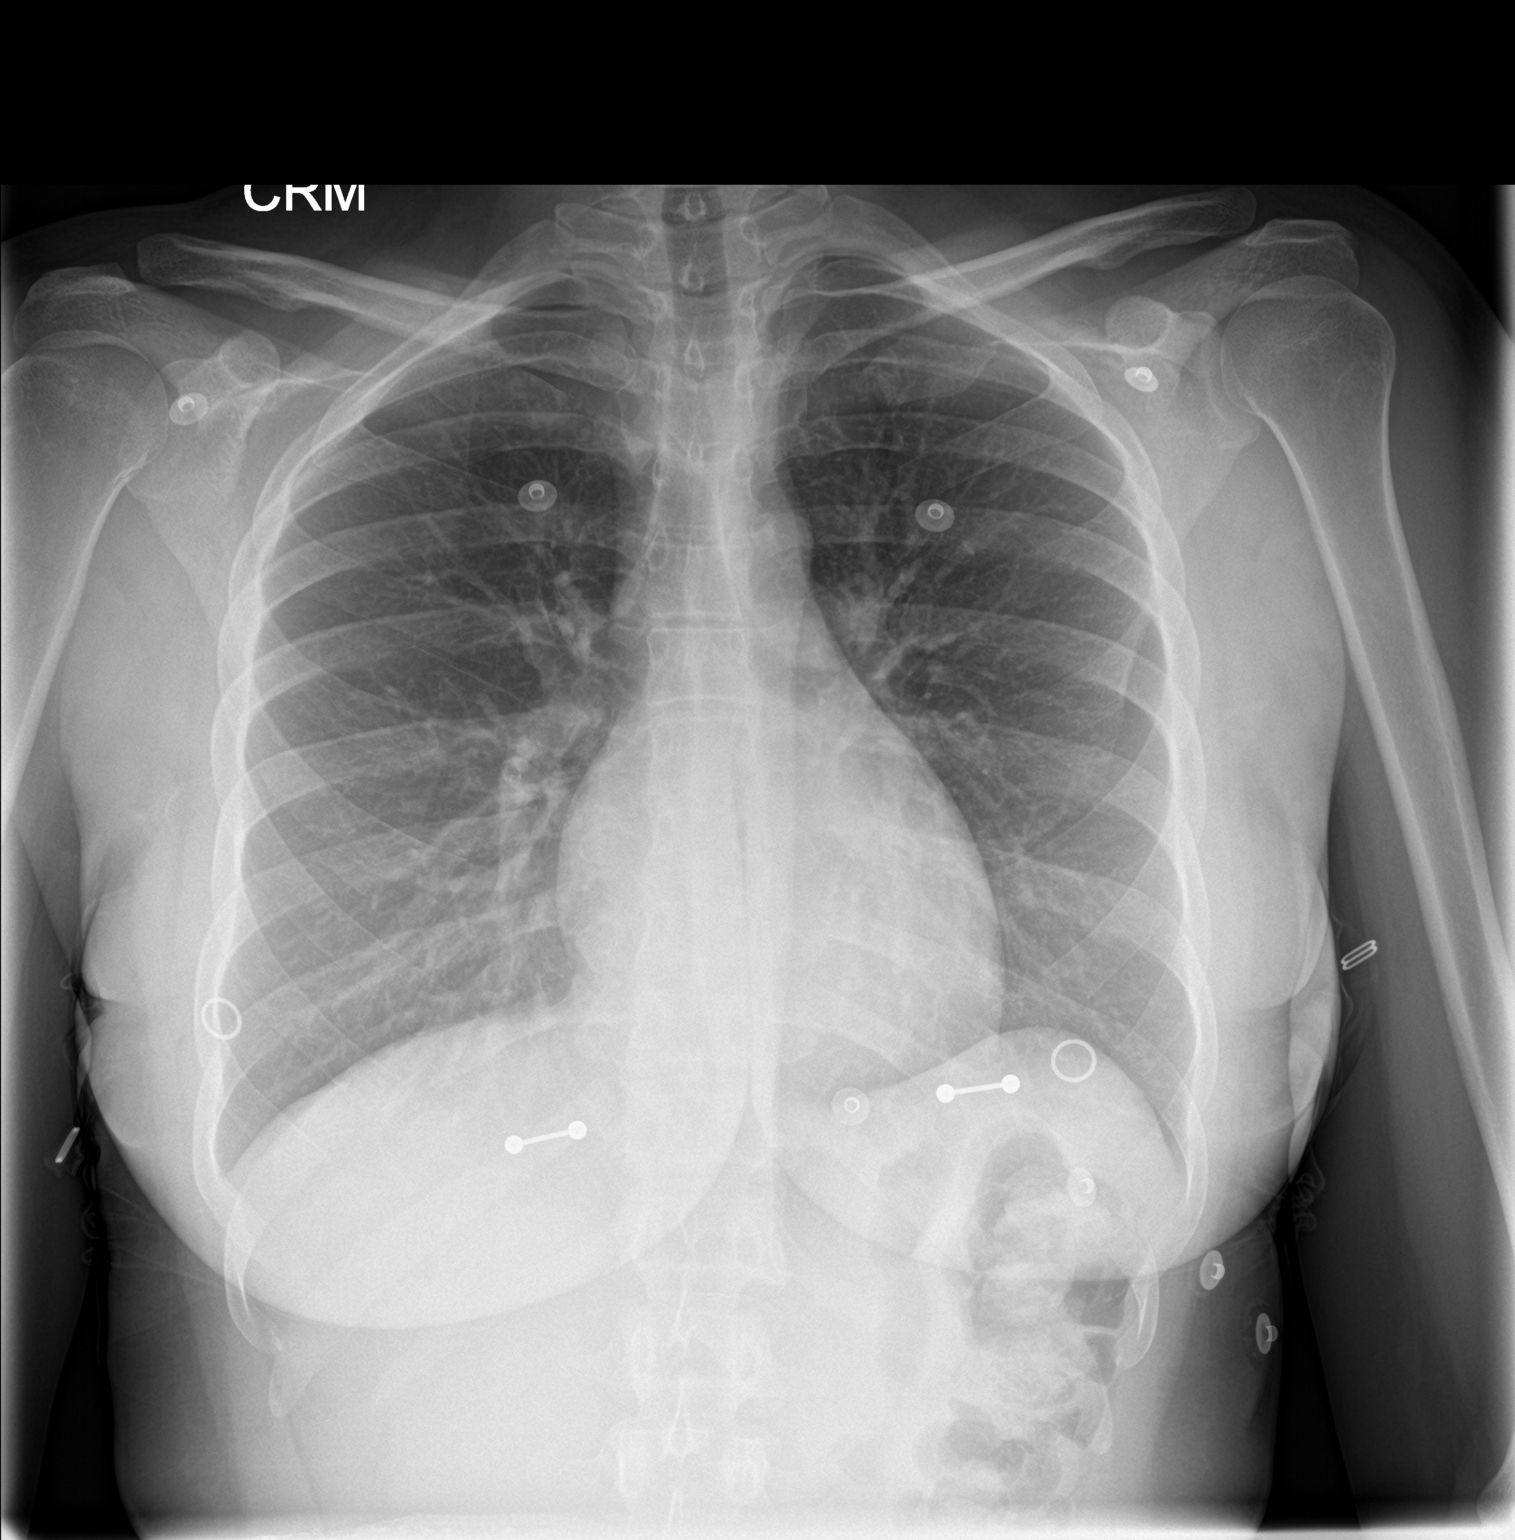

[chest lat]
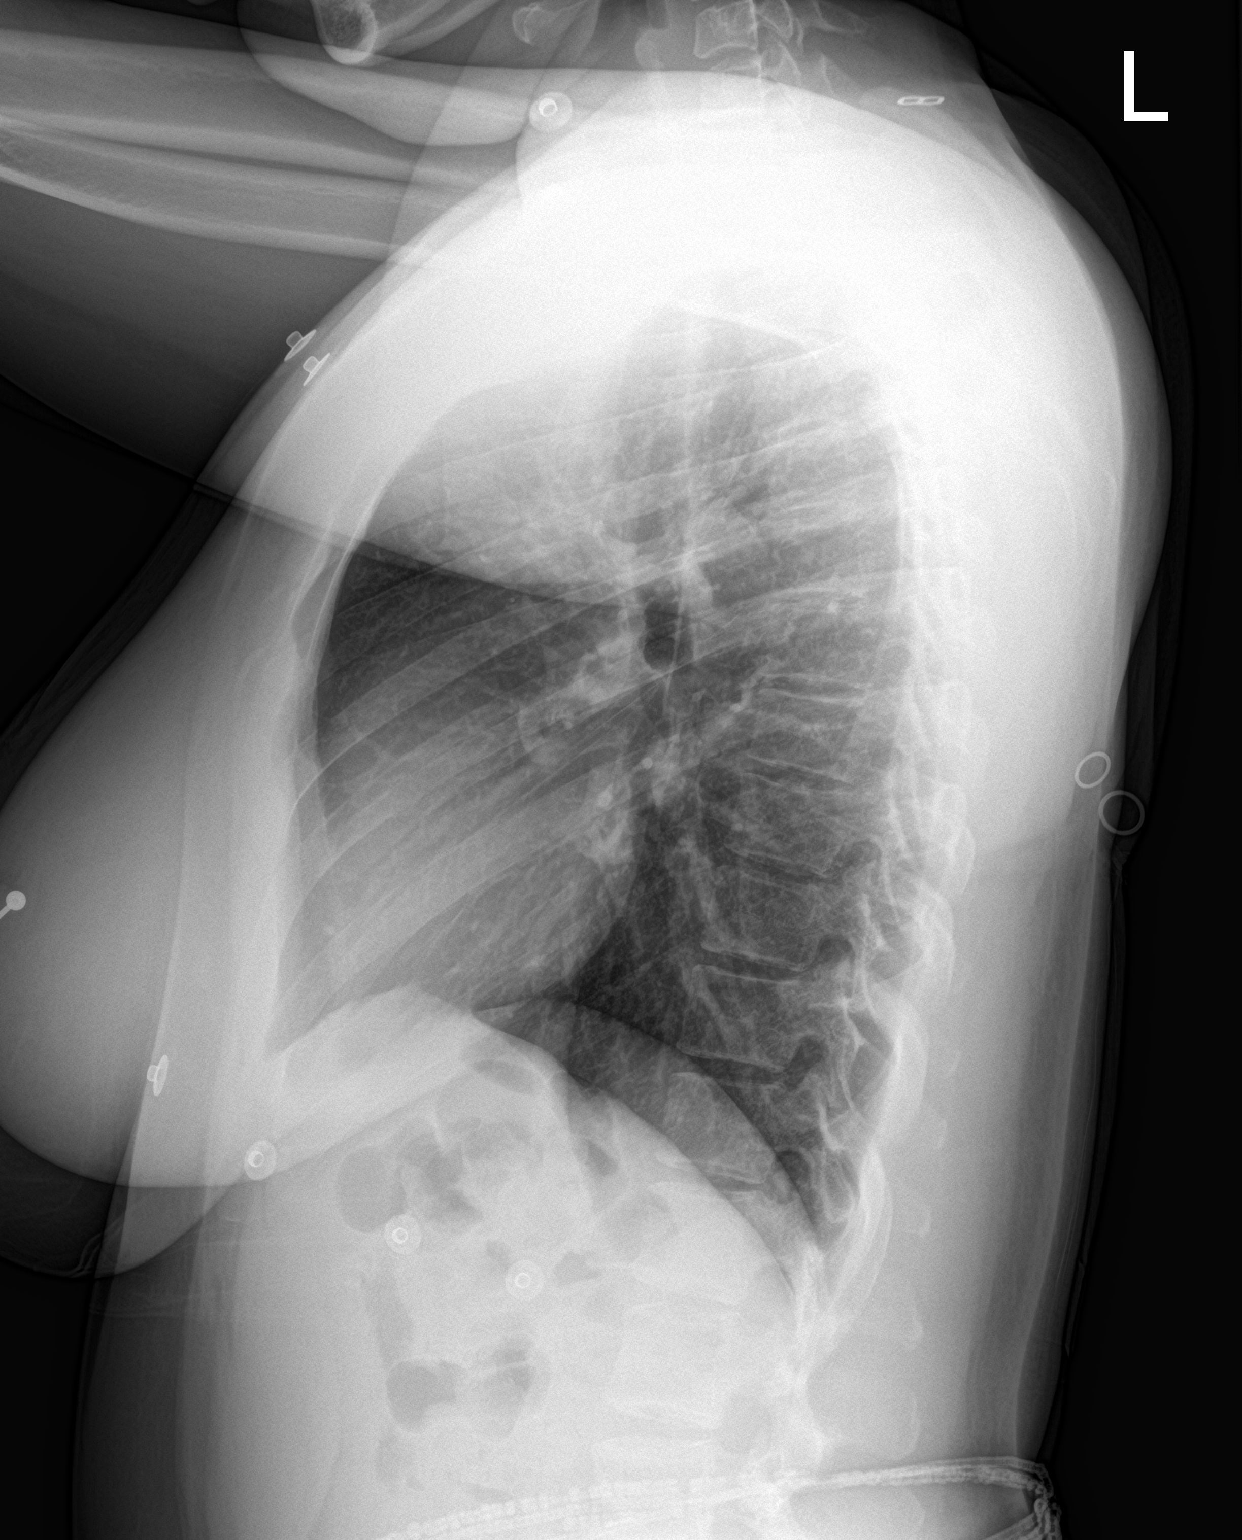

[2 of 2 positions shown; findings below may reference images not displayed]

FINDINGS: Normal heart, mediastinum and hila.

Clear lungs.  No pleural effusion or pneumothorax.

Skeletal structures are unremarkable.
IMPRESSION: Normal chest radiographs.

## 2023-08-28 ENCOUNTER — Telehealth: Payer: Self-pay

## 2023-08-28 ENCOUNTER — Encounter: Payer: Managed Care, Other (non HMO) | Admitting: Family

## 2023-08-28 NOTE — Progress Notes (Signed)
Erroneous encounter-disregard

## 2023-08-28 NOTE — Telephone Encounter (Signed)
Called pt and left vm to call office back to reschedule missed NP appt

## 2023-09-04 ENCOUNTER — Encounter (HOSPITAL_BASED_OUTPATIENT_CLINIC_OR_DEPARTMENT_OTHER): Payer: Managed Care, Other (non HMO)

## 2023-09-17 ENCOUNTER — Other Ambulatory Visit (HOSPITAL_COMMUNITY)
Admission: RE | Admit: 2023-09-17 | Discharge: 2023-09-17 | Disposition: A | Discharge status: Home / Self Care | Payer: Medicaid Other | Source: Ambulatory Visit | Attending: Certified Nurse Midwife | Admitting: Certified Nurse Midwife

## 2023-09-17 ENCOUNTER — Ambulatory Visit (HOSPITAL_BASED_OUTPATIENT_CLINIC_OR_DEPARTMENT_OTHER): Payer: Medicaid Other

## 2023-09-17 ENCOUNTER — Encounter (HOSPITAL_BASED_OUTPATIENT_CLINIC_OR_DEPARTMENT_OTHER): Payer: Self-pay

## 2023-09-17 ENCOUNTER — Encounter (HOSPITAL_BASED_OUTPATIENT_CLINIC_OR_DEPARTMENT_OTHER): Payer: Self-pay | Admitting: Certified Nurse Midwife

## 2023-09-17 ENCOUNTER — Ambulatory Visit (INDEPENDENT_AMBULATORY_CARE_PROVIDER_SITE_OTHER): Payer: Medicaid Other | Admitting: *Deleted

## 2023-09-17 ENCOUNTER — Ambulatory Visit (HOSPITAL_BASED_OUTPATIENT_CLINIC_OR_DEPARTMENT_OTHER): Payer: Medicaid Other | Admitting: Certified Nurse Midwife

## 2023-09-17 VITALS — BP 98/65 | HR 89 | Wt 184.4 lb

## 2023-09-17 DIAGNOSIS — Z3A10 10 weeks gestation of pregnancy: Secondary | ICD-10-CM

## 2023-09-17 DIAGNOSIS — Z3481 Encounter for supervision of other normal pregnancy, first trimester: Secondary | ICD-10-CM | POA: Insufficient documentation

## 2023-09-17 DIAGNOSIS — O3680X1 Pregnancy with inconclusive fetal viability, fetus 1: Secondary | ICD-10-CM

## 2023-09-17 DIAGNOSIS — O3680X0 Pregnancy with inconclusive fetal viability, not applicable or unspecified: Secondary | ICD-10-CM

## 2023-09-17 DIAGNOSIS — Z124 Encounter for screening for malignant neoplasm of cervix: Secondary | ICD-10-CM

## 2023-09-17 DIAGNOSIS — Z3480 Encounter for supervision of other normal pregnancy, unspecified trimester: Secondary | ICD-10-CM | POA: Diagnosis not present

## 2023-09-17 NOTE — Progress Notes (Signed)
INITIAL PRENATAL VISIT  Subjective:   Tammy Welch is being seen today for her first obstetrical visit.  This is a planned pregnancy. This is a desired pregnancy.  She is at [redacted]w[redacted]d gestation by LMP/US Her obstetrical history is significant for  none . Relationship with FOB: significant other, living together. Patient does intend to breast feed. Pregnancy history fully reviewed.  Patient reports nausea.  Indications for ASA therapy (per uptodate) One of the following: Previous pregnancy with preeclampsia, especially early onset and with an adverse outcome No Multifetal gestation No Chronic hypertension No Type 1 or 2 diabetes mellitus No Chronic kidney disease No Autoimmune disease (antiphospholipid syndrome, systemic lupus erythematosus) No  Two or more of the following: Nulliparity No Obesity (body mass index >30 kg/m2) Yes Family history of preeclampsia in mother or sister No Age >=35 years No Sociodemographic characteristics (African American race, low socioeconomic level) Yes Personal risk factors (eg, previous pregnancy with low birth weight or small for gestational age infant, previous adverse pregnancy outcome [eg, stillbirth], interval >10 years between pregnancies) No  Indications for early GDM screening  First-degree relative with diabetes No BMI >30kg/m2 Yes Age > 25 No Previous birth of an infant weighing >=4000 g No Gestational diabetes mellitus in a previous pregnancy No Glycated hemoglobin >=5.7 percent (39 mmol/mol), impaired glucose tolerance, or impaired fasting glucose on previous testing No High-risk race/ethnicity (eg, African American, Latino, Native American, Asian American, Pacific Islander) Yes Previous stillbirth of unknown cause No Maternal birthweight > 9 lbs No History of cardiovascular disease No Hypertension or on therapy for hypertension No High-density lipoprotein cholesterol level <35 mg/dL (1.47 mmol/L) and/or a triglyceride level  >250 mg/dL (8.29 mmol/L) No Polycystic ovary syndrome No Physical inactivity No Other clinical condition associated with insulin resistance (eg, severe obesity, acanthosis nigricans) No Current use of glucocorticoids No   Early screening tests: FBS, A1C, Random CBG, glucose challenge   Review of Systems:   Review of Systems  Objective:    Obstetric History OB History  Gravida Para Term Preterm AB Living  2 1 1     1   SAB IAB Ectopic Multiple Live Births        0 1    # Outcome Date GA Lbr Len/2nd Weight Sex Type Anes PTL Lv  2 Current           1 Term 05/01/15 [redacted]w[redacted]d 02:22 / 01:06 7 lb 2.8 oz (3.255 kg) F Vag-Spont EPI  LIV    Past Medical History:  Diagnosis Date   Anxiety    no meds   Boil of buttock    recurrent   Chlamydia 03/31/2015   Treated, TOC 04/20/15   Depression    History of marijuana use 03/25/2014   SVD (spontaneous vaginal delivery) 05/01/2015    No past surgical history on file.  Current Outpatient Medications on File Prior to Visit  Medication Sig Dispense Refill   lidocaine (XYLOCAINE) 2 % solution Use as directed 15 mLs in the mouth or throat every 6 (six) hours as needed for mouth pain. 300 mL 0   promethazine (PHENERGAN) 25 MG tablet Take 1 tablet (25 mg total) by mouth every 6 (six) hours as needed for nausea or vomiting. 12 tablet 0   No current facility-administered medications on file prior to visit.    No Known Allergies  Social History:  reports that she has never smoked. She has never used smokeless tobacco. She reports that she does not currently use  alcohol. She reports that she does not currently use drugs after having used the following drugs: Marijuana. Frequency: 2.00 times per week.  Family History  Problem Relation Age of Onset   Hypertension Mother    Hypertension Other     The following portions of the patient's history were reviewed and updated as appropriate: allergies, current medications, past family history, past  medical history, past social history, past surgical history and problem list.  Review of Systems Review of Systems    Physical Exam:  BP 98/65   Pulse 89   Wt 184 lb 6.4 oz (83.6 kg)   LMP 07/07/2023   BMI 33.73 kg/m  CONSTITUTIONAL: Well-developed, well-nourished female in no acute distress.  HENT:  Normocephalic, atraumatic.  Oropharynx is clear and moist EYES: Conjunctivae normal. No scleral icterus.  NECK: Normal range of motion, supple, no masses.  Normal thyroid.  SKIN: Skin is warm and dry. No rash noted. Not diaphoretic. No erythema. No pallor. MUSCULOSKELETAL: Normal range of motion. No tenderness.  No cyanosis, clubbing, or edema.   NEUROLOGIC: Alert and oriented to person, place, and time. Normal muscle tone coordination.  PSYCHIATRIC: Normal mood and affect. Normal behavior. Normal judgment and thought content. CARDIOVASCULAR: Normal heart rate noted, regular rhythm RESPIRATORY: Clear to auscultation bilaterally. Effort and breath sounds normal, no problems with respiration noted. BREASTS: Symmetric in size. No masses, skin changes, nipple drainage, or lymphadenopathy. ABDOMEN: Soft, normal bowel sounds, no distention noted.  No tenderness, rebound or guarding. Fundal ht:  PELVIC: Normal appearing external genitalia; normal appearing vaginal mucosa and cervix.  No abnormal discharge noted.  Pap smear obtained.  Normal uterine size, no other palpable masses, no uterine or adnexal tenderness.      Movement: Absent       Assessment:    Pregnancy: G2P1001  1. Encounter for supervision of other normal pregnancy in first trimester - ABO/Rh - Antibody screen - CBC - Hepatitis B surface antigen - HIV Antibody (routine testing w rflx) - HIV (Save tube for possible reflex) - RPR - Rubella screen - Hepatitis C antibody - Urine Culture - Hemoglobin A1c - PANORAMA PRENATAL TEST - HORIZON Basic Panel - Cytology - PAP( [place])  2. [redacted] weeks gestation of  pregnancy  3. Cervical cancer screening - Routine pap smear collected     Plan:     Initial labs drawn. Prenatal vitamins. Problem list reviewed and updated. Reviewed in detail the nature of the practice with collaborative care between  Genetic screening discussed: NIPS/First trimester screen/Quad/AFP requested. Role of ultrasound in pregnancy discussed; Anatomy US: requested. Amniocentesis discussed: not indicated. Follow up in 4 weeks. Discussed clinic routines, schedule of care and testing, genetic screening options, involvement of students and residents under the direct supervision of APPs and doctors and presence of female providers. Pt verbalized understanding.   Letta Kocher, CNM 09/17/2023 4:06 PM

## 2023-09-17 NOTE — Progress Notes (Signed)
New OB Intake  I explained I am completing New OB Intake today. We discussed EDD of 04/12/2024, by Last Menstrual Period. Pt is G2P1001. I reviewed her allergies, medications and Medical/Surgical/OB history.    Patient Active Problem List   Diagnosis Date Noted   Supervision of other normal pregnancy, antepartum 09/17/2023   Anxiety 10/04/2019   Depressive disorder 10/04/2019    Concerns addressed today  Delivery Plans Plans to deliver at [facility]. Discussed the nature of our practice with multiple providers including residents and students. Due to the size of the practice, the delivering provider may not be the same as those providing prenatal care.   Patient  is unsure if  interested in water birth. Offered upcoming OB visit with CNM to discuss further.  MyChart/Babyscripts MyChart access verified. I explained pt will have some visits in office and some virtually. Babyscripts instructions given and order placed.   Blood Pressure Cuff/Weight Scale Blood pressure cuff ordered for patient to pick-up from [employer]. Explained after first prenatal appt pt will check weekly and document in Babyscripts. Patient does not have weight scale; order sent to [facility], patient may track weight weekly in Babyscripts.  Anatomy US Explained first scheduled [place] will be around 19 weeks. Anatomy US scheduled for 11/19/23 at 1415.    Is patient a candidate for Babyscripts Optimization? Yes, patient accepted    First visit review I reviewed new OB appt with patient. Explained pt will be seen by Merrilee Jansky, CNM at first visit. Discussed Avelina Laine genetic screening with patient. Pt would like both Panorama and Horizon. Does not want to know the gender at this time. Routine prenatal labs  needed.    Last Pap 09/10/22-normal with +HPV  Harrie Jeans, RN 09/17/2023  3:34 PM

## 2023-09-18 LAB — ABO/RH: Rh Factor: POSITIVE

## 2023-09-18 LAB — CBC
Hematocrit: 37.1 % (ref 34.0–46.6)
Hemoglobin: 12.1 g/dL (ref 11.1–15.9)
MCH: 28.6 pg (ref 26.6–33.0)
MCHC: 32.6 g/dL (ref 31.5–35.7)
MCV: 88 fL (ref 79–97)
Platelets: 254 10*3/uL (ref 150–450)
RBC: 4.23 x10E6/uL (ref 3.77–5.28)
RDW: 13.8 % (ref 11.7–15.4)
WBC: 8.1 10*3/uL (ref 3.4–10.8)

## 2023-09-18 LAB — HEMOGLOBIN A1C
Est. average glucose Bld gHb Est-mCnc: 108 mg/dL
Hgb A1c MFr Bld: 5.4 % (ref 4.8–5.6)

## 2023-09-18 LAB — HIV ANTIBODY (ROUTINE TESTING W REFLEX): HIV Screen 4th Generation wRfx: NONREACTIVE

## 2023-09-18 LAB — RUBELLA SCREEN: Rubella Antibodies, IGG: 4.14 {index} (ref >0.99)

## 2023-09-18 LAB — HEPATITIS B SURFACE ANTIGEN: Hepatitis B Surface Ag: NEGATIVE

## 2023-09-18 LAB — ANTIBODY SCREEN: Antibody Screen: NEGATIVE

## 2023-09-18 LAB — RPR: RPR Ser Ql: NONREACTIVE

## 2023-09-18 LAB — HEPATITIS C ANTIBODY: Hep C Virus Ab: NONREACTIVE

## 2023-09-21 LAB — URINE CULTURE

## 2023-09-22 ENCOUNTER — Other Ambulatory Visit (HOSPITAL_BASED_OUTPATIENT_CLINIC_OR_DEPARTMENT_OTHER): Payer: Self-pay | Admitting: Certified Nurse Midwife

## 2023-09-22 DIAGNOSIS — O2341 Unspecified infection of urinary tract in pregnancy, first trimester: Secondary | ICD-10-CM | POA: Insufficient documentation

## 2023-09-22 MED ORDER — NITROFURANTOIN MONOHYD MACRO 100 MG PO CAPS: 100.0000 mg | ORAL_CAPSULE | Freq: Two times a day (BID) | ORAL | 0 refills | Status: DC

## 2023-09-24 LAB — CYTOLOGY - PAP
Chlamydia: NEGATIVE
Comment: NEGATIVE
Comment: NEGATIVE
Comment: NEGATIVE
Comment: NEGATIVE
Comment: NEGATIVE
Comment: NORMAL
Diagnosis: UNDETERMINED — AB
HPV 16: NEGATIVE
HPV 18 / 45: NEGATIVE
High risk HPV: POSITIVE — AB
Neisseria Gonorrhea: NEGATIVE
Trichomonas: NEGATIVE

## 2023-09-26 LAB — PANORAMA PRENATAL TEST FULL PANEL:PANORAMA TEST PLUS 5 ADDITIONAL MICRODELETIONS: FETAL FRACTION: 5.6

## 2023-09-29 ENCOUNTER — Telehealth (HOSPITAL_BASED_OUTPATIENT_CLINIC_OR_DEPARTMENT_OTHER): Payer: Self-pay | Admitting: Certified Nurse Midwife

## 2023-09-29 NOTE — Telephone Encounter (Signed)
Telephone call to patient. Voicemail. Left message letting her know that urine culture was positive for UTI and medication was sent to pharmacy.  Tammy Welch

## 2023-09-30 LAB — HORIZON CUSTOM: REPORT SUMMARY: NEGATIVE

## 2023-10-02 ENCOUNTER — Encounter (HOSPITAL_BASED_OUTPATIENT_CLINIC_OR_DEPARTMENT_OTHER): Payer: Self-pay | Admitting: Certified Nurse Midwife

## 2023-10-02 MED ORDER — BLOOD PRESSURE KIT DEVI: 1.0000 | Freq: Once | 0 refills | Status: AC

## 2023-10-18 ENCOUNTER — Other Ambulatory Visit: Payer: Self-pay

## 2023-10-18 ENCOUNTER — Inpatient Hospital Stay (HOSPITAL_COMMUNITY)
Admission: AD | Admit: 2023-10-18 | Discharge: 2023-10-19 | Disposition: A | Discharge status: Home / Self Care | Payer: Medicaid Other | Attending: Obstetrics & Gynecology | Admitting: Obstetrics & Gynecology

## 2023-10-18 DIAGNOSIS — R102 Pelvic and perineal pain: Secondary | ICD-10-CM | POA: Diagnosis present

## 2023-10-18 DIAGNOSIS — O99282 Endocrine, nutritional and metabolic diseases complicating pregnancy, second trimester: Secondary | ICD-10-CM | POA: Diagnosis not present

## 2023-10-18 DIAGNOSIS — R030 Elevated blood-pressure reading, without diagnosis of hypertension: Secondary | ICD-10-CM | POA: Diagnosis present

## 2023-10-18 DIAGNOSIS — O26892 Other specified pregnancy related conditions, second trimester: Secondary | ICD-10-CM | POA: Insufficient documentation

## 2023-10-18 DIAGNOSIS — E860 Dehydration: Secondary | ICD-10-CM | POA: Diagnosis not present

## 2023-10-18 DIAGNOSIS — Z3A14 14 weeks gestation of pregnancy: Secondary | ICD-10-CM | POA: Insufficient documentation

## 2023-10-18 DIAGNOSIS — N949 Unspecified condition associated with female genital organs and menstrual cycle: Secondary | ICD-10-CM | POA: Diagnosis not present

## 2023-10-18 DIAGNOSIS — R519 Headache, unspecified: Secondary | ICD-10-CM

## 2023-10-18 LAB — URINALYSIS, ROUTINE W REFLEX MICROSCOPIC
Bacteria, UA: NONE SEEN
Bilirubin Urine: NEGATIVE
Glucose, UA: NEGATIVE mg/dL
Hgb urine dipstick: NEGATIVE
Ketones, ur: 20 mg/dL — AB
Nitrite: NEGATIVE
Protein, ur: 30 mg/dL — AB
Specific Gravity, Urine: 1.033 — ABNORMAL HIGH (ref 1.005–1.030)
pH: 5 (ref 5.0–8.0)

## 2023-10-18 LAB — WET PREP, GENITAL
Clue Cells Wet Prep HPF POC: NONE SEEN
Sperm: NONE SEEN
Trich, Wet Prep: NONE SEEN
WBC, Wet Prep HPF POC: >=10 — AB (ref <10)
Yeast Wet Prep HPF POC: NONE SEEN

## 2023-10-18 NOTE — MAU Provider Note (Signed)
None     S Ms. Tammy Welch is a 32 y.o. G50P1001 pregnant/non-pregnant female at [redacted]w[redacted]d who presents to MAU today with complaint of lower abdominal cramping when she walks. Increases when she moves, sharp pain reported. Denies vaginal bleeding or discharge. She has not tried anything to help. Denies wearing a pregnancy support belt. Tammy Welch care at [facility]. Prenatal records reviewed.  Pertinent items noted in HPI and remainder of comprehensive ROS otherwise negative.   O BP 119/62 (BP Location: Left Arm)   Pulse 88   Temp 98.6 F (37 C) (Oral)   Resp 18   Ht 5\' 2"  (1.575 m)   Wt 83.6 kg   LMP 07/07/2023   SpO2 100%   BMI 33.71 kg/m  Physical Exam Vitals reviewed.  Constitutional:      General: She is not in acute distress.    Appearance: Normal appearance. She is not ill-appearing, toxic-appearing or diaphoretic.  HENT:     Head: Normocephalic.  Cardiovascular:     Rate and Rhythm: Normal rate.     Pulses: Normal pulses.  Pulmonary:     Effort: Pulmonary effort is normal.  Skin:    General: Skin is warm and dry.     Capillary Refill: Capillary refill takes less than 2 seconds.  Neurological:     Mental Status: She is alert and oriented to person, place, and time.  Psychiatric:        Mood and Affect: Mood normal.        Behavior: Behavior normal.        Thought Content: Thought content normal.        Judgment: Judgment normal.    Results for orders placed or performed during the hospital encounter of 10/18/23 (from the past 24 hours)  Urinalysis, Routine w reflex microscopic -Urine, Clean Catch     Status: Abnormal   Collection Time: 10/18/23  9:10 PM  Result Value Ref Range   Color, Urine YELLOW YELLOW   APPearance HAZY (A) CLEAR   Specific Gravity, Urine 1.033 (H) 1.005 - 1.030   pH 5.0 5.0 - 8.0   Glucose, UA NEGATIVE NEGATIVE mg/dL   Hgb urine dipstick NEGATIVE NEGATIVE   Bilirubin Urine NEGATIVE NEGATIVE   Ketones, ur 20 (A) NEGATIVE mg/dL    Protein, ur 30 (A) NEGATIVE mg/dL   Nitrite NEGATIVE NEGATIVE   Leukocytes,Ua TRACE (A) NEGATIVE   RBC / HPF 0-5 0 - 5 RBC/hpf   WBC, UA 6-10 0 - 5 WBC/hpf   Bacteria, UA NONE SEEN NONE SEEN   Squamous Epithelial / HPF 0-5 0 - 5 /HPF   Mucus PRESENT   Wet prep, genital     Status: Abnormal   Collection Time: 10/18/23 10:13 PM  Result Value Ref Range   Yeast Wet Prep HPF POC NONE SEEN NONE SEEN   Trich, Wet Prep NONE SEEN NONE SEEN   Clue Cells Wet Prep HPF POC NONE SEEN NONE SEEN   WBC, Wet Prep HPF POC >=10 (A) <10   Sperm NONE SEEN     MDM: UA, Wet prep, GC, Reviewed records  MAU Course:  A Round ligament pain Dehydration  Medical screening exam complete  P Discharge from MAU in stable condition with routine precautions Follow up at [facility] as scheduled for ongoing prenatal care RLP support suggestions provided, including support belt, massage and stretches. DC information given.  Encouraged to increased PO hydration.   Future Appointments  Date Time Provider Department Center  10/20/2023  3:55 PM Tammy Welch, CNM [facility]  11/19/2023  2:15 PM [facility] NURSE [facility]  11/19/2023  2:30 PM [facility] US2 [facility]   Allergies as of 10/19/2023   No Known Allergies      Medication List     TAKE these medications    lidocaine 2 % solution Commonly known as: XYLOCAINE Use as directed 15 mLs in the mouth or throat every 6 (six) hours as needed for mouth pain.   nitrofurantoin (macrocrystal-monohydrate) 100 MG capsule Commonly known as: MACROBID Take 1 capsule (100 mg total) by mouth 2 (two) times daily.   promethazine 25 MG tablet Commonly known as: PHENERGAN Take 1 tablet (25 mg total) by mouth every 6 (six) hours as needed for nausea or vomiting.        Tammy Welch, CNM 10/19/2023 7:47 AM

## 2023-10-18 NOTE — MAU Note (Signed)
.  Tammy Welch is a 32 y.o. at [redacted]w[redacted]d here in MAU reporting: dizzy spells, migraines, checking BP weekly - 150/97 earlier, but 130/68 most recently. Denies current migraine, but reports lower abdominal cramping that worsens when she walks. Denies VB or abnormal discharge.   LM Onset of complaint: 1 week Pain score: 6 Vitals:   10/18/23 2105  BP: 116/66  Pulse: 95  Resp: 18  Temp: 98.6 F (37 C)  SpO2: 100%     FHT:156 Lab orders placed from triage: UA

## 2023-10-19 NOTE — Discharge Instructions (Signed)
Safe Medications in Pregnancy   Acne:  Benzoyl Peroxide  Salicylic Acid   Backache/Headache:  Tylenol: 2 regular strength every 4 hours OR               2 Extra strength every 6 hours   Colds/Coughs/Allergies:  Benadryl (alcohol free) 25 mg every 6 hours as needed  Breath right strips  Claritin  Cepacol throat lozenges  Chloraseptic throat spray  Cold-Eeze- up to three times per day  Cough drops, alcohol free  Flonase (by prescription only)  Guaifenesin  Mucinex  Robitussin DM (plain only, alcohol free)  Saline nasal spray/drops  Sudafed (pseudoephedrine) & Actifed * use only after [redacted] weeks gestation and if you do not have high blood pressure  Tylenol  Vicks Vaporub  Zinc lozenges  Zyrtec   Constipation:  Colace  Ducolax suppositories  Fleet enema  Glycerin suppositories  Metamucil  Milk of magnesia  Miralax  Senokot  Smooth move tea   Diarrhea:  Kaopectate  Imodium A-D   *NO pepto Bismol   Hemorrhoids:  Anusol  Anusol HC  Preparation H  Tucks   Indigestion:  Tums  Maalox  Mylanta  Zantac  Pepcid   Insomnia:  Benadryl (alcohol free) 25mg  every 6 hours as needed  Tylenol PM  Unisom, no Gelcaps   Leg Cramps:  Tums  MagGel   Nausea/Vomiting:  Bonine  Dramamine  Emetrol  Ginger extract  Sea bands  Meclizine  Nausea medication to take during pregnancy:  Unisom (doxylamine succinate 25 mg tablets) Take one tablet daily at bedtime. If symptoms are not adequately controlled, the dose can be increased to a maximum recommended dose of two tablets daily (1/2 tablet in the morning, 1/2 tablet mid-afternoon and one at bedtime).  Vitamin B6 100mg  tablets. Take one tablet twice a day (up to 200 mg per day).   Skin Rashes:  Aveeno products  Benadryl cream or 25mg  every 6 hours as needed  Calamine Lotion  1% cortisone cream   Yeast infection:  Gyne-lotrimin 7  Monistat 7    **If taking multiple medications, please check labels to avoid  duplicating the same active ingredients  **take medication as directed on the label  ** Do not exceed 4000 mg of tylenol in 24 hours  **Do not take medications that contain aspirin or ibuprofen          For prevention of migraines in pregnancy: -Magnesium, 400mg  by mouth, once daily -Vitamin B2, 400mg  by mouth, once daily  For treatment of migraines in pregnancy: -take medication at the first sign of the pain of a headache, or the first sign of your aura -start with 1000mg  Tylenol (do not exceed 4000mg  of Tylenol in 24hrs), with or without Reglan 10mg  -if no relief after 1-2hours, can take Flexeril 10mg  -if headache is severe and not relieved by the above, may take Fioricet, 1 tablet, no more than 3 days per month -Fioricet should only be used as a rescue medication, when absolutely necessary -if the above regimen does not resolve your headache at all, please come to MAU for additional treatment -if you take Fioricet, please be aware that this has Tylenol in it and will contribute to the 4,000mg  of Tylenol that you are allowed to take per day   Comfort Measures for Round Ligament Pain:  Soak in a warm tub Tylenol 1000 mg by mouth every 6-8 hrs as needed for pain Slow position changes Do pelvic massages and chair stretches (as demonstrated to you)  Laying on the affected side

## 2023-10-20 ENCOUNTER — Ambulatory Visit (HOSPITAL_BASED_OUTPATIENT_CLINIC_OR_DEPARTMENT_OTHER): Payer: Medicaid Other | Admitting: Certified Nurse Midwife

## 2023-10-20 VITALS — BP 102/55 | HR 93 | Wt 185.2 lb

## 2023-10-20 DIAGNOSIS — Z3480 Encounter for supervision of other normal pregnancy, unspecified trimester: Secondary | ICD-10-CM

## 2023-10-20 DIAGNOSIS — Z3A15 15 weeks gestation of pregnancy: Secondary | ICD-10-CM

## 2023-10-20 DIAGNOSIS — O2341 Unspecified infection of urinary tract in pregnancy, first trimester: Secondary | ICD-10-CM

## 2023-10-20 LAB — GC/CHLAMYDIA PROBE AMP (~~LOC~~) NOT AT ARMC
Chlamydia: NEGATIVE
Comment: NEGATIVE
Comment: NORMAL
Neisseria Gonorrhea: NEGATIVE

## 2023-10-20 MED ORDER — ONDANSETRON HCL 8 MG PO TABS: 8.0000 mg | ORAL_TABLET | Freq: Three times a day (TID) | ORAL | 3 refills | Status: DC | PRN

## 2023-10-23 ENCOUNTER — Telehealth (HOSPITAL_BASED_OUTPATIENT_CLINIC_OR_DEPARTMENT_OTHER): Payer: Self-pay

## 2023-10-23 NOTE — Telephone Encounter (Signed)
 Called patient and LMOM at 9:47 for her to give our office a call back. I would like to communicate to the patient that  I sent in prior authorization for Ondansetron  HCl 8mg  tablets and it was denied. Insurance states that she needs to try PROMETHAZINE  first. tbw

## 2023-11-04 ENCOUNTER — Encounter (HOSPITAL_BASED_OUTPATIENT_CLINIC_OR_DEPARTMENT_OTHER): Payer: Self-pay | Admitting: Certified Nurse Midwife

## 2023-11-19 ENCOUNTER — Ambulatory Visit: Payer: Medicaid Other | Admitting: *Deleted

## 2023-11-19 ENCOUNTER — Ambulatory Visit: Payer: Medicaid Other | Attending: Certified Nurse Midwife

## 2023-11-19 ENCOUNTER — Other Ambulatory Visit: Payer: Self-pay | Admitting: *Deleted

## 2023-11-19 ENCOUNTER — Ambulatory Visit (HOSPITAL_BASED_OUTPATIENT_CLINIC_OR_DEPARTMENT_OTHER): Payer: Medicaid Other | Admitting: Maternal & Fetal Medicine

## 2023-11-19 ENCOUNTER — Encounter: Payer: Self-pay | Admitting: *Deleted

## 2023-11-19 VITALS — BP 116/67 | HR 87

## 2023-11-19 DIAGNOSIS — Z3A19 19 weeks gestation of pregnancy: Secondary | ICD-10-CM | POA: Diagnosis not present

## 2023-11-19 DIAGNOSIS — O409XX0 Polyhydramnios, unspecified trimester, not applicable or unspecified: Secondary | ICD-10-CM | POA: Insufficient documentation

## 2023-11-19 DIAGNOSIS — Z3689 Encounter for other specified antenatal screening: Secondary | ICD-10-CM

## 2023-11-19 DIAGNOSIS — Z363 Encounter for antenatal screening for malformations: Secondary | ICD-10-CM | POA: Diagnosis not present

## 2023-11-19 DIAGNOSIS — O402XX0 Polyhydramnios, second trimester, not applicable or unspecified: Secondary | ICD-10-CM | POA: Diagnosis not present

## 2023-11-19 DIAGNOSIS — Z3480 Encounter for supervision of other normal pregnancy, unspecified trimester: Secondary | ICD-10-CM | POA: Diagnosis present

## 2023-11-19 DIAGNOSIS — O99212 Obesity complicating pregnancy, second trimester: Secondary | ICD-10-CM | POA: Insufficient documentation

## 2023-11-19 DIAGNOSIS — O403XX0 Polyhydramnios, third trimester, not applicable or unspecified: Secondary | ICD-10-CM | POA: Insufficient documentation

## 2023-11-19 DIAGNOSIS — E669 Obesity, unspecified: Secondary | ICD-10-CM | POA: Diagnosis not present

## 2023-11-19 NOTE — Progress Notes (Signed)
   Patient information  Patient Name: Tammy Welch [facility]  Patient MRN:   [id number]  Referring practice: MFM Referring Provider: [facility]  MFM CONSULT  [facility] NEEB is a 33 y.o. G2P1001 at [redacted]w[redacted]d here for ultrasound and consultation. Patient Active Problem List   Diagnosis Date Noted   Polyhydramnios affecting pregnancy 11/19/2023   Urinary tract infection in mother during first trimester of pregnancy 09/22/2023   Supervision of other normal pregnancy, antepartum 09/17/2023   Anxiety 10/04/2019   Depressive disorder 10/04/2019   Tammy Welch is doing well today with no acute concerns. She denies contractions, bleeding, or loss of fluid and reports good fetal movement.   RE polyhydramnios: There is mild polyhydramnios on today's ultrasound.  I discussed that at this gestational age this is likely normal but future ultrasounds should be done to assess for any potential cause such as gestational diabetes or structural abnormalities.  She will return in 9 weeks after her glucose test to assess the fetal anatomy and the fluid level.  Sonographic findings Single intrauterine pregnancy at 19w 2d. Fetal cardiac activity:  Observed and appears normal. Presentation: Variable. The anatomic structures that were well seen appear normal without evidence of soft markers. The anatomic survey is complete.  Fetal biometry shows the estimated fetal weight at the 44 percentile. Amniotic fluid: Polyhydramnios.  MVP: 8.53 cm. Placenta: Anterior. Adnexa: No abnormality visualized. Cervical length: 4.7 cm.  There are limitations of prenatal ultrasound such as the inability to detect certain abnormalities due to poor visualization. Various factors such as fetal position, gestational age and maternal body habitus may increase the difficulty in visualizing the fetal anatomy.    Recommendations -EDD is Estimated Date of Delivery: 04/12/24. -Detailed ultrasound was done today without  abnormalities. -Polyhydramnios will be assessed on future ultrasound around [redacted] weeks gestation -Consider early glucose testing  -Continue routine prenatal care with referring OB provider  Review of Systems: A review of systems was performed and was negative except per HPI   Vitals and Physical Exam    11/19/2023    2:20 PM 10/20/2023    3:57 PM 10/19/2023    1:00 AM  Vitals with BMI  Weight  185 lbs 3 oz   BMI  33.86   Systolic 116 102 409  Diastolic 67 55 62  Pulse 87 93 88    Sitting comfortably on the sonogram table Nonlabored breathing Normal rate and rhythm Abdomen is nontender  Past pregnancies OB History  Gravida Para Term Preterm AB Living  2 1 1   1   SAB IAB Ectopic Multiple Live Births     0 1    # Outcome Date GA Lbr Len/2nd Weight Sex Type Anes PTL Lv  2 Current           1 Term 05/01/15 [redacted]w[redacted]d 02:22 / 01:06 7 lb 2.8 oz (3.255 kg) F Vag-Spont EPI  LIV     I spent 20 minutes reviewing the patients chart, including labs and images as well as counseling the patient about her medical conditions. Greater than 50% of the time was spent in direct face-to-face patient counseling.  Braxton Feathers  MFM, [facility]   11/19/2023  4:40 PM

## 2023-11-20 ENCOUNTER — Encounter (HOSPITAL_BASED_OUTPATIENT_CLINIC_OR_DEPARTMENT_OTHER): Payer: Medicaid Other | Admitting: Certified Nurse Midwife

## 2023-11-21 ENCOUNTER — Encounter (HOSPITAL_BASED_OUTPATIENT_CLINIC_OR_DEPARTMENT_OTHER): Payer: Self-pay | Admitting: Certified Nurse Midwife

## 2023-11-24 ENCOUNTER — Encounter (HOSPITAL_BASED_OUTPATIENT_CLINIC_OR_DEPARTMENT_OTHER): Payer: Medicaid Other | Admitting: Certified Nurse Midwife

## 2023-11-25 ENCOUNTER — Encounter (HOSPITAL_BASED_OUTPATIENT_CLINIC_OR_DEPARTMENT_OTHER): Payer: Self-pay | Admitting: Certified Nurse Midwife

## 2023-11-25 ENCOUNTER — Ambulatory Visit (INDEPENDENT_AMBULATORY_CARE_PROVIDER_SITE_OTHER): Payer: Medicaid Other | Admitting: Certified Nurse Midwife

## 2023-11-25 VITALS — BP 100/65 | HR 79 | Wt 189.0 lb

## 2023-11-25 DIAGNOSIS — Z8659 Personal history of other mental and behavioral disorders: Secondary | ICD-10-CM | POA: Insufficient documentation

## 2023-11-25 DIAGNOSIS — Z3480 Encounter for supervision of other normal pregnancy, unspecified trimester: Secondary | ICD-10-CM

## 2023-11-25 DIAGNOSIS — O99891 Other specified diseases and conditions complicating pregnancy: Secondary | ICD-10-CM

## 2023-11-25 DIAGNOSIS — O402XX0 Polyhydramnios, second trimester, not applicable or unspecified: Secondary | ICD-10-CM

## 2023-11-25 DIAGNOSIS — Z3A20 20 weeks gestation of pregnancy: Secondary | ICD-10-CM

## 2023-11-25 DIAGNOSIS — O2341 Unspecified infection of urinary tract in pregnancy, first trimester: Secondary | ICD-10-CM | POA: Diagnosis not present

## 2023-11-25 NOTE — Patient Instructions (Signed)
[email or web address]

## 2023-11-25 NOTE — Progress Notes (Signed)
 PRENATAL VISIT NOTE  Subjective:  Tammy Welch is a 33 y.o. G2P1001 at [redacted]w[redacted]d being seen today for ongoing prenatal care.  She is currently monitored for the following issues for this  pregnancy and has Anxiety; Depressive disorder; Supervision of other normal pregnancy, antepartum; Urinary tract infection in mother during first trimester of pregnancy; Polyhydramnios in second trimester; and Hx of postpartum depression, currently pregnant on their problem list.  Patient reports no bleeding, no contractions, no cramping, and no leaking.  Contractions: Not present. Vag. Bleeding: None.  Movement: Present. Denies leaking of fluid. She feels active fetal movements.   The following portions of the patient's history were reviewed and updated as appropriate: allergies, current medications, past family history, past medical history, past social history, past surgical history and problem list.   Objective:   Vitals:   11/25/23 0809  BP: 100/65  Pulse: 79  Weight: 189 lb (85.7 kg)    Fetal Status: Fetal Heart Rate (bpm): 150   Movement: Present     General:  Alert, oriented and cooperative. Patient is in no acute distress.  Skin: Skin is warm and dry. No rash noted.   Cardiovascular: Normal heart rate noted  Respiratory: Normal respiratory effort, no problems with respiration noted  Abdomen: Soft, gravid, appropriate for gestational age.  Pain/Pressure: Absent     Pelvic: Cervical exam deferred        Extremities: Normal range of motion.  Edema: None  Mental Status: Normal mood and affect. Normal behavior. Normal judgment and thought content.   Assessment and Plan:  Pregnancy: G2P1001 at [redacted]w[redacted]d  1. Supervision of other normal pregnancy, antepartum (Primary) - US  (11/19/23) 19w2: Ant placenta, AFI largest pocket 8.53cm (mild polyhydramnios), EFW 10oz (44%), AC 54%, HC 51%, anatomy scan complete.CL 4.7cm.  Discussed w/ patient (today) need for early 2hr GTT to rule out impaired glucose  metabolism. She will RTO later this week for 2hr GTT and AFP - Pt hopes to breastfeed, has a double electric breast pump  2. [redacted] weeks gestation of pregnancy - Will collect AFP only with 2hr GTT later this week  3. Urinary tract infection in mother during first trimester of pregnancy - Urine TOC sent - Urine Culture  4. Mild Polyhydramnios 19weeks - See US  report above, US  reviewed w/ patient  5. Hx of postpartum depression, currently pregnant - Pt states she took Fluoxetine  and Vistaril  in the past, had postpartum depression after her daughter's birth 8 years ago. She does not feel that she needs medication or counseling at this time, but we discussed that starting Fluoxetine  around 36 weeks (or anytime before) is an option.   Preterm labor symptoms and general obstetric precautions including but not limited to vaginal bleeding, contractions, leaking of fluid and fetal movement were reviewed in detail with the patient. Please refer to After Visit Summary for other counseling recommendations.   Return for Schedule 2hr GTT (soon).Will do AFP only with 2hr GTT labs this week.   Future Appointments  Date Time Provider Department Center  12/15/2023  9:55 AM Pennie Vanblarcom, Arland POUR, CNM [facility]  01/12/2024  8:35 AM Travaris Kosh, Arland POUR, CNM [facility]  01/21/2024  3:30 PM [facility] US1 [facility]  02/02/2024 10:55 AM Cleotilde Ronal RAMAN, MD [facility]  02/16/2024  9:55 AM Cleotilde Ronal RAMAN, MD [facility]  03/01/2024  9:55 AM Tad, Arland POUR, CNM [facility]  03/16/2024  9:55 AM Cleotilde Ronal RAMAN, MD [facility]  03/22/2024  9:55 AM  Tad Arland POUR, CNM [facility]  03/29/2024  9:55 AM Cleotilde Ronal RAMAN, MD [facility]  04/05/2024 10:15 AM Tad, Arland POUR, CNM [facility]  04/12/2024  9:55 AM Cleotilde Ronal RAMAN, MD [facility]    Arland POUR Tad, CNM

## 2023-11-26 ENCOUNTER — Other Ambulatory Visit (HOSPITAL_BASED_OUTPATIENT_CLINIC_OR_DEPARTMENT_OTHER): Payer: Self-pay | Admitting: Certified Nurse Midwife

## 2023-11-26 ENCOUNTER — Encounter (HOSPITAL_BASED_OUTPATIENT_CLINIC_OR_DEPARTMENT_OTHER): Payer: Self-pay | Admitting: Certified Nurse Midwife

## 2023-11-26 MED ORDER — ASPIRIN 81 MG PO TBEC: 81.0000 mg | DELAYED_RELEASE_TABLET | Freq: Every day | ORAL | 12 refills | Status: DC

## 2023-11-27 ENCOUNTER — Other Ambulatory Visit (HOSPITAL_BASED_OUTPATIENT_CLINIC_OR_DEPARTMENT_OTHER): Payer: Medicaid Other

## 2023-11-27 LAB — URINE CULTURE

## 2023-12-02 ENCOUNTER — Other Ambulatory Visit (HOSPITAL_BASED_OUTPATIENT_CLINIC_OR_DEPARTMENT_OTHER): Payer: Medicaid Other

## 2023-12-02 DIAGNOSIS — O402XX0 Polyhydramnios, second trimester, not applicable or unspecified: Secondary | ICD-10-CM

## 2023-12-02 DIAGNOSIS — Z3480 Encounter for supervision of other normal pregnancy, unspecified trimester: Secondary | ICD-10-CM

## 2023-12-02 NOTE — Addendum Note (Signed)
Addended by: Ina Homes B on: 12/02/2023 08:45 AM   Modules accepted: Orders

## 2023-12-02 NOTE — Progress Notes (Signed)
Patient came in for 2 hour GTT. AFP was drawn as well. tbw

## 2023-12-03 ENCOUNTER — Encounter (HOSPITAL_BASED_OUTPATIENT_CLINIC_OR_DEPARTMENT_OTHER): Payer: Self-pay | Admitting: Certified Nurse Midwife

## 2023-12-03 LAB — GLUCOSE TOLERANCE, 2 HOURS W/ 1HR
Glucose, 1 hour: 115 mg/dL (ref 70–179)
Glucose, 2 hour: 89 mg/dL (ref 70–152)
Glucose, Fasting: 81 mg/dL (ref 70–91)

## 2023-12-04 LAB — AFP, SERUM, OPEN SPINA BIFIDA
AFP MoM: 1.27
AFP Value: 76.5 ng/mL
Gest. Age on Collection Date: 21 wk
Maternal Age At EDD: 32.8 a
OSBR Risk 1 IN: 10000
Test Results:: NEGATIVE
Weight: 190 [lb_av]

## 2023-12-08 ENCOUNTER — Other Ambulatory Visit (HOSPITAL_COMMUNITY)
Admission: RE | Admit: 2023-12-08 | Discharge: 2023-12-08 | Disposition: A | Discharge status: Home / Self Care | Payer: Medicaid Other | Source: Ambulatory Visit | Attending: Certified Nurse Midwife | Admitting: Certified Nurse Midwife

## 2023-12-08 ENCOUNTER — Ambulatory Visit (HOSPITAL_BASED_OUTPATIENT_CLINIC_OR_DEPARTMENT_OTHER): Payer: Medicaid Other | Admitting: Certified Nurse Midwife

## 2023-12-08 VITALS — BP 110/68 | HR 84 | Wt 194.8 lb

## 2023-12-08 DIAGNOSIS — N898 Other specified noninflammatory disorders of vagina: Secondary | ICD-10-CM | POA: Diagnosis present

## 2023-12-08 DIAGNOSIS — Z3482 Encounter for supervision of other normal pregnancy, second trimester: Secondary | ICD-10-CM | POA: Diagnosis not present

## 2023-12-08 DIAGNOSIS — Z3A22 22 weeks gestation of pregnancy: Secondary | ICD-10-CM | POA: Diagnosis not present

## 2023-12-08 MED ORDER — METRONIDAZOLE 0.75 % VA GEL: 1.0000 | Freq: Every day | VAGINAL | 0 refills | Status: DC

## 2023-12-08 NOTE — Progress Notes (Signed)
  Vaginal odor. Hx BV. Pt states she feels like she likely has BV. Prefers gel over po tablets.     PRENATAL VISIT NOTE  Subjective:  Tammy Welch is a 33 y.o. G2P1001 at [redacted]w[redacted]d being seen today for ongoing prenatal care.  She is currently monitored for the following issues for this  pregnancy and has Anxiety; Depressive disorder; Supervision of other normal pregnancy, antepartum; Urinary tract infection in mother during first trimester of pregnancy; Polyhydramnios in second trimester; and Hx of postpartum depression, currently pregnant on their problem list.  Patient reports  vaginal odor. Pt thinks she has BV because she has experienced BV in the past and only symptom was vaginal odor .  Contractions: Not present. Vag. Bleeding: None.  Movement: Present. Denies leaking of fluid.   The following portions of the patient's history were reviewed and updated as appropriate: allergies, current medications, past family history, past medical history, past social history, past surgical history and problem list.   Objective:   Vitals:   12/08/23 1534  BP: 110/68  Pulse: 84  Weight: 194 lb 12.8 oz (88.4 kg)    Fetal Status:     Movement: Present     General:  Alert, oriented and cooperative. Patient is in no acute distress.  Skin: Skin is warm and dry. No rash noted.   Cardiovascular: Normal heart rate noted  Respiratory: Normal respiratory effort, no problems with respiration noted  Abdomen: Soft, gravid, appropriate for gestational age.  Pain/Pressure: Absent     Pelvic:         Extremities: Normal range of motion.  Edema: None  Mental Status: Normal mood and affect. Normal behavior. Normal judgment and thought content.   Assessment and Plan:  Pregnancy: G2P1001 at [redacted]w[redacted]d 1. Vaginal odor (Primary) - Cervicovaginal ancillary only( [place])  Preterm labor symptoms and general obstetric precautions including but not limited to vaginal bleeding, contractions, leaking of fluid and  fetal movement were reviewed in detail with the patient. Please refer to After Visit Summary for other counseling recommendations.   No follow-ups on file.  Future Appointments  Date Time Provider Department Center  12/15/2023  9:55 AM Fiona Coto, Toma Aran, CNM [facility]  01/12/2024  8:35 AM Jesly Hartmann, Toma Aran, CNM [facility]  01/21/2024  3:30 PM [facility] US1 [facility]  02/02/2024 10:55 AM Herald Vallin, Toma Aran, CNM [facility]  02/16/2024  9:55 AM Jerene Bears, MD [facility]  03/01/2024  9:55 AM Carthel Castille, Toma Aran, CNM [facility]  03/16/2024  9:55 AM Jerene Bears, MD [facility]  03/22/2024  9:55 AM Marton Redwood, Toma Aran, CNM [facility]  03/29/2024  9:55 AM Jerene Bears, MD [facility]  04/05/2024 10:15 AM Marton Redwood, Toma Aran, CNM [facility]  04/12/2024  9:55 AM Jerene Bears, MD [facility]    Letta Kocher, CNM

## 2023-12-09 ENCOUNTER — Encounter (HOSPITAL_BASED_OUTPATIENT_CLINIC_OR_DEPARTMENT_OTHER): Payer: Self-pay | Admitting: Certified Nurse Midwife

## 2023-12-09 LAB — CERVICOVAGINAL ANCILLARY ONLY
Bacterial Vaginitis (gardnerella): NEGATIVE
Candida Glabrata: NEGATIVE
Candida Vaginitis: NEGATIVE
Chlamydia: NEGATIVE
Comment: NEGATIVE
Comment: NEGATIVE
Comment: NEGATIVE
Comment: NEGATIVE
Comment: NEGATIVE
Comment: NORMAL
Neisseria Gonorrhea: NEGATIVE
Trichomonas: NEGATIVE

## 2023-12-15 ENCOUNTER — Encounter (HOSPITAL_BASED_OUTPATIENT_CLINIC_OR_DEPARTMENT_OTHER): Payer: Medicaid Other | Admitting: Certified Nurse Midwife

## 2023-12-15 ENCOUNTER — Ambulatory Visit (HOSPITAL_BASED_OUTPATIENT_CLINIC_OR_DEPARTMENT_OTHER): Payer: Medicaid Other | Admitting: Certified Nurse Midwife

## 2023-12-15 VITALS — BP 97/64 | HR 86 | Wt 195.4 lb

## 2023-12-15 DIAGNOSIS — F32A Depression, unspecified: Secondary | ICD-10-CM

## 2023-12-15 DIAGNOSIS — O99891 Other specified diseases and conditions complicating pregnancy: Secondary | ICD-10-CM | POA: Diagnosis not present

## 2023-12-15 DIAGNOSIS — Z3A23 23 weeks gestation of pregnancy: Secondary | ICD-10-CM

## 2023-12-15 DIAGNOSIS — Z8659 Personal history of other mental and behavioral disorders: Secondary | ICD-10-CM

## 2023-12-15 DIAGNOSIS — Z3480 Encounter for supervision of other normal pregnancy, unspecified trimester: Secondary | ICD-10-CM

## 2023-12-15 DIAGNOSIS — F419 Anxiety disorder, unspecified: Secondary | ICD-10-CM

## 2023-12-15 DIAGNOSIS — O402XX0 Polyhydramnios, second trimester, not applicable or unspecified: Secondary | ICD-10-CM | POA: Diagnosis not present

## 2023-12-15 DIAGNOSIS — O2341 Unspecified infection of urinary tract in pregnancy, first trimester: Secondary | ICD-10-CM

## 2023-12-15 NOTE — Progress Notes (Signed)
 PRENATAL VISIT NOTE  Subjective:  Tammy Welch is a 33 y.o. G2P1001 at [redacted]w[redacted]d being seen today for ongoing prenatal care.  She is currently monitored for the following issues for this  pregnancy and has Anxiety; Depressive disorder; Supervision of other normal pregnancy, antepartum; Urinary tract infection in mother during first trimester of pregnancy; Polyhydramnios in second trimester; and Hx of postpartum depression, currently pregnant on their problem list.  Patient reports no complaints.  Contractions: Not present. Vag. Bleeding: None.  Movement: Present. Denies leaking of fluid.   The following portions of the patient's history were reviewed and updated as appropriate: allergies, current medications, past family history, past medical history, past social history, past surgical history and problem list.   Objective:   Vitals:   12/15/23 1523  BP: 97/64  Pulse: 86  Weight: 195 lb 6.4 oz (88.6 kg)    Fetal Status: Fetal Heart Rate (bpm): 148   Movement: Present     General:  Alert, oriented and cooperative. Patient is in no acute distress.  Skin: Skin is warm and dry. No rash noted.   Cardiovascular: Normal heart rate noted  Respiratory: Normal respiratory effort, no problems with respiration noted  Abdomen: Soft, gravid, appropriate for gestational age.  Pain/Pressure: Present     Pelvic: Cervical exam deferred        Extremities: Normal range of motion.  Edema: None  Mental Status: Normal mood and affect. Normal behavior. Normal judgment and thought content.   Assessment and Plan:  Pregnancy: G2P1001 at [redacted]w[redacted]d 1. Supervision of other normal pregnancy, antepartum (Primary) -  [place] (11/19/23) 19w2: Ant placenta, AFI largest pocket 8.53cm (mild polyhydramnios), EFW 10oz (44%), AC 54%, HC 51%, anatomy scan complete.CL 4.7cm.  Discussed w/ patient (today) need for early 2hr GTT to rule out impaired glucose metabolism. She will RTO later this week for 2hr GTT and AFP - Pt  hopes to breastfeed, has a double electric breast pump  2. Polyhydramnios in second trimester, single or unspecified fetus - Follow-up US scheduled  3. Urinary tract infection in mother during first trimester of pregnancy - TOC Negative  4. Anxiety - Pt doing well at this time  5. Depressive disorder - Doing well  6. Hx of postpartum depression, currently pregnant - Pt states she took Fluoxetine and Vistaril in the past, had postpartum depression after her daughter's birth 8 years ago. She does not feel that she needs medication or counseling at this time, but we discussed that starting Fluoxetine around 36 weeks (or anytime before) is an option   7. [redacted] weeks gestation of pregnancy - Discussed 2hr GTT at next RPV in 4 weeks  Preterm labor symptoms and general obstetric precautions including but not limited to vaginal bleeding, contractions, leaking of fluid and fetal movement were reviewed in detail with the patient. Please refer to After Visit Summary for other counseling recommendations.   No follow-ups on file.  Future Appointments  Date Time Provider Department Center  12/15/2023  3:35 PM Letta Kocher, CNM [facility]  01/12/2024  8:35 AM Wes Lezotte, Toma Aran, CNM [facility]  01/21/2024  3:30 PM [facility]1 [facility]  02/02/2024  3:15 PM Maili Shutters, Toma Aran, CNM [facility]  02/18/2024  3:35 PM Jerene Bears, MD [facility]  03/01/2024  3:35 PM Deshay Blumenfeld, Toma Aran, CNM [facility]  03/16/2024  3:35 PM Jerene Bears, MD [facility]  03/22/2024  3:55 PM Pressley Tadesse, Toma Aran, CNM [facility]  03/29/2024  3:55 PM Jerene Bears, MD [facility]  04/05/2024  3:35 PM Sindy Mccune, Toma Aran, CNM [facility]  04/12/2024  3:35 PM Jerene Bears, MD [facility]    Letta Kocher, CNM

## 2023-12-25 ENCOUNTER — Other Ambulatory Visit (HOSPITAL_BASED_OUTPATIENT_CLINIC_OR_DEPARTMENT_OTHER): Payer: Medicaid Other

## 2024-01-09 ENCOUNTER — Telehealth (HOSPITAL_BASED_OUTPATIENT_CLINIC_OR_DEPARTMENT_OTHER): Payer: Self-pay | Admitting: Certified Nurse Midwife

## 2024-01-12 ENCOUNTER — Encounter (HOSPITAL_BASED_OUTPATIENT_CLINIC_OR_DEPARTMENT_OTHER): Payer: Medicaid Other | Admitting: Certified Nurse Midwife

## 2024-01-12 NOTE — Telephone Encounter (Signed)
 Transferring to different location.  Tammy Welch

## 2024-01-13 ENCOUNTER — Ambulatory Visit (INDEPENDENT_AMBULATORY_CARE_PROVIDER_SITE_OTHER): Payer: Medicaid Other | Admitting: Advanced Practice Midwife

## 2024-01-13 ENCOUNTER — Other Ambulatory Visit: Payer: Medicaid Other

## 2024-01-13 VITALS — BP 114/71 | HR 105 | Wt 200.0 lb

## 2024-01-13 DIAGNOSIS — F419 Anxiety disorder, unspecified: Secondary | ICD-10-CM

## 2024-01-13 DIAGNOSIS — Z3480 Encounter for supervision of other normal pregnancy, unspecified trimester: Secondary | ICD-10-CM

## 2024-01-13 DIAGNOSIS — Z3A27 27 weeks gestation of pregnancy: Secondary | ICD-10-CM

## 2024-01-13 DIAGNOSIS — O402XX0 Polyhydramnios, second trimester, not applicable or unspecified: Secondary | ICD-10-CM

## 2024-01-13 DIAGNOSIS — Z8659 Personal history of other mental and behavioral disorders: Secondary | ICD-10-CM

## 2024-01-13 DIAGNOSIS — O99891 Other specified diseases and conditions complicating pregnancy: Secondary | ICD-10-CM

## 2024-01-13 NOTE — Progress Notes (Signed)
 States having vaginal pressure at times. None now.

## 2024-01-13 NOTE — Progress Notes (Signed)
   PRENATAL VISIT NOTE  Subjective:  Tammy Welch is a 33 y.o. G2P1001 at [redacted]w[redacted]d being seen today for ongoing prenatal care.  She is currently monitored for the following issues for this low-risk pregnancy and has Anxiety; Depressive disorder; Supervision of other normal pregnancy, antepartum; Urinary tract infection in mother during first trimester of pregnancy; Polyhydramnios in second trimester; and Hx of postpartum depression, currently pregnant on their problem list.  Patient reports no complaints.  Contractions: Not present. Vag. Bleeding: None.  Movement: Present. Denies leaking of fluid.   The following portions of the patient's history were reviewed and updated as appropriate: allergies, current medications, past family history, past medical history, past social history, past surgical history and problem list.   Objective:   Vitals:   01/13/24 0926  BP: 114/71  Pulse: (!) 105  Weight: 200 lb (90.7 kg)    Fetal Status:     Movement: Present     General:  Alert, oriented and cooperative. Patient is in no acute distress.  Skin: Skin is warm and dry. No rash noted.   Cardiovascular: Normal heart rate noted  Respiratory: Normal respiratory effort, no problems with respiration noted  Abdomen: Soft, gravid, appropriate for gestational age.  Pain/Pressure: Present     Pelvic: Cervical exam deferred        Extremities: Normal range of motion.  Edema: None  Mental Status: Normal mood and affect. Normal behavior. Normal judgment and thought content.   Assessment and Plan:  Pregnancy: G2P1001 at [redacted]w[redacted]d 1. Supervision of other normal pregnancy, antepartum (Primary) --Anticipatory guidance about next visits/weeks of pregnancy given.   - Glucose Tolerance, 2 Hours w/1 Hour - HIV antibody (with reflex) - CBC - RPR  2. [redacted] weeks gestation of pregnancy  - Glucose Tolerance, 2 Hours w/1 Hour - HIV antibody (with reflex) - CBC - RPR  3. Polyhydramnios in second trimester,  single or unspecified fetus   4. Anxiety --No meds, stable  5. Hx of postpartum depression, currently pregnant --Doing well, considering starting SSRI at 36 weeks  Preterm labor symptoms and general obstetric precautions including but not limited to vaginal bleeding, contractions, leaking of fluid and fetal movement were reviewed in detail with the patient. Please refer to After Visit Summary for other counseling recommendations.   No follow-ups on file.  Future Appointments  Date Time Provider Department Center  01/21/2024  3:30 PM [facility] US1 [facility]    Sharen Counter, CNM

## 2024-01-14 LAB — CBC
Hematocrit: 34.7 % (ref 34.0–46.6)
Hemoglobin: 11.5 g/dL (ref 11.1–15.9)
MCH: 29.7 pg (ref 26.6–33.0)
MCHC: 33.1 g/dL (ref 31.5–35.7)
MCV: 90 fL (ref 79–97)
Platelets: 172 10*3/uL (ref 150–450)
RBC: 3.87 x10E6/uL (ref 3.77–5.28)
RDW: 14.1 % (ref 11.7–15.4)
WBC: 7.3 10*3/uL (ref 3.4–10.8)

## 2024-01-14 LAB — HIV ANTIBODY (ROUTINE TESTING W REFLEX): HIV Screen 4th Generation wRfx: NONREACTIVE

## 2024-01-14 LAB — GLUCOSE TOLERANCE, 2 HOURS W/ 1HR
Glucose, 1 hour: 116 mg/dL (ref 70–179)
Glucose, 2 hour: 89 mg/dL (ref 70–152)
Glucose, Fasting: 79 mg/dL (ref 70–91)

## 2024-01-14 LAB — RPR: RPR Ser Ql: NONREACTIVE

## 2024-01-21 ENCOUNTER — Ambulatory Visit: Payer: Medicaid Other | Attending: Maternal & Fetal Medicine

## 2024-01-21 ENCOUNTER — Ambulatory Visit (HOSPITAL_BASED_OUTPATIENT_CLINIC_OR_DEPARTMENT_OTHER): Admitting: Maternal & Fetal Medicine

## 2024-01-21 DIAGNOSIS — O402XX0 Polyhydramnios, second trimester, not applicable or unspecified: Secondary | ICD-10-CM | POA: Insufficient documentation

## 2024-01-21 DIAGNOSIS — E669 Obesity, unspecified: Secondary | ICD-10-CM

## 2024-01-21 DIAGNOSIS — O409XX0 Polyhydramnios, unspecified trimester, not applicable or unspecified: Secondary | ICD-10-CM | POA: Insufficient documentation

## 2024-01-21 DIAGNOSIS — Z3A28 28 weeks gestation of pregnancy: Secondary | ICD-10-CM | POA: Insufficient documentation

## 2024-01-21 DIAGNOSIS — O99213 Obesity complicating pregnancy, third trimester: Secondary | ICD-10-CM | POA: Diagnosis not present

## 2024-01-21 NOTE — Progress Notes (Signed)
   Patient information  Patient Name: Tammy [facility]  Patient MRN:   [id number]  Referring practice: [facility]FM Referring Provider: [facility]  MFM CONSULT  [facility] Tammy Welch is Tammy 33 y.o. G2P1001 at [redacted]w[redacted]d here for ultrasound and consultation. Patient Active Problem List   Diagnosis Date Noted   Hx of postpartum depression, currently pregnant 11/25/2023   Polyhydramnios in second trimester 11/19/2023   Urinary tract infection in mother during first trimester of pregnancy 09/22/2023   Supervision of other normal pregnancy, antepartum 09/17/2023   Anxiety 10/04/2019   Depressive disorder 10/04/2019    Tammy Welch has Tammy pregnancy with the complications mentioned in the problem list. During today's visit we focused on the following concerns:   RE polyhydramnios: There is continued polyhydramnios on today's ultrasound.  The patient passed her Glucola.  I discussed the presence of polyhydramnios is currently idiopathic but may represent Tammy fetal gastrointestinal problem such as atresia or volvulus or tracheoesophageal fistula, which can be difficult to diagnose prenatally and further ultrasounds are needed to monitor.    Sonographic findings Single intrauterine pregnancy at 28w 2d.  Fetal cardiac activity:  Observed and appears normal. Presentation: Cephalic. Interval fetal anatomy appears normal. Fetal biometry shows the estimated fetal weight at the 18 percentile. Amniotic fluid volume: Polyhydramnios. MVP: 8.01 cm. Placenta: Anterior.  There are limitations of prenatal ultrasound such as the inability to detect certain abnormalities due to poor visualization. Various factors such as fetal position, gestational age and maternal body habitus may increase the difficulty in visualizing the fetal anatomy.    Recommendations -F/u US in 4 weeks - Antenatal testing reserved for moderate poly (AFI >30 cm) at greater than 32 weeks.  -Continue routine prenatal care with  referring OB provider  Review of Systems: Tammy review of systems was performed and was negative except per HPI   Vitals and Physical Exam    01/21/2024    3:10 PM 01/13/2024    9:26 AM 12/15/2023    3:23 PM  Vitals with BMI  Weight  200 lbs 195 lbs 6 oz  Systolic 111 114 97  Diastolic 57 71 64  Pulse 85 105 86    Sitting comfortably on the sonogram table Nonlabored breathing Normal rate and rhythm Abdomen is nontender  Past pregnancies OB History  Gravida Para Term Preterm AB Living  2 1 1   1   SAB IAB Ectopic Multiple Live Births     0 1    # Outcome Date GA Lbr Len/2nd Weight Sex Type Anes PTL Lv  2 Current           1 Term 05/01/15 [redacted]w[redacted]d 02:22 / 01:06 7 lb 2.8 oz (3.255 kg) F Vag-Spont EPI  LIV    I spent 20 minutes reviewing the patients chart, including labs and images as well as counseling the patient about her medical conditions. Greater than 50% of the time was spent in direct face-to-face patient counseling.  Braxton Feathers, DO Maternal fetal medicine, [place]   01/21/2024  4:25 PM

## 2024-01-22 ENCOUNTER — Other Ambulatory Visit: Payer: Self-pay | Admitting: *Deleted

## 2024-01-22 DIAGNOSIS — O402XX0 Polyhydramnios, second trimester, not applicable or unspecified: Secondary | ICD-10-CM

## 2024-02-02 ENCOUNTER — Encounter (HOSPITAL_BASED_OUTPATIENT_CLINIC_OR_DEPARTMENT_OTHER): Payer: Medicaid Other | Admitting: Certified Nurse Midwife

## 2024-02-02 ENCOUNTER — Ambulatory Visit (INDEPENDENT_AMBULATORY_CARE_PROVIDER_SITE_OTHER): Admitting: Obstetrics & Gynecology

## 2024-02-02 ENCOUNTER — Encounter: Payer: Self-pay | Admitting: Obstetrics & Gynecology

## 2024-02-02 VITALS — BP 97/67 | HR 85 | Wt 201.0 lb

## 2024-02-02 DIAGNOSIS — O403XX0 Polyhydramnios, third trimester, not applicable or unspecified: Secondary | ICD-10-CM

## 2024-02-02 DIAGNOSIS — Z3480 Encounter for supervision of other normal pregnancy, unspecified trimester: Secondary | ICD-10-CM

## 2024-02-02 DIAGNOSIS — Z302 Encounter for sterilization: Secondary | ICD-10-CM | POA: Insufficient documentation

## 2024-02-02 DIAGNOSIS — Z3A30 30 weeks gestation of pregnancy: Secondary | ICD-10-CM | POA: Diagnosis not present

## 2024-02-02 NOTE — Patient Instructions (Signed)
 TDaP Vaccine Pregnancy Get the Whooping Cough Vaccine While You Are Pregnant (CDC)  It is important for women to get the whooping cough vaccine in the third trimester of each pregnancy. Vaccines are the best way to prevent this disease. There are 2 different whooping cough vaccines. Both vaccines combine protection against whooping cough, tetanus and diphtheria, but they are for different age groups: Tdap: for everyone 11 years or older, including pregnant women  DTaP: for children 2 months through 10 years of age  You need the whooping cough vaccine during each of your pregnancies The recommended time to get the shot is during your 27th through 36th week of pregnancy, preferably during the earlier part of this time period. The Centers for Disease Control and Prevention (CDC) recommends that pregnant women receive the whooping cough vaccine for adolescents and adults (called Tdap vaccine) during the third trimester of each pregnancy. The recommended time to get the shot is during your 27th through 36th week of pregnancy, preferably during the earlier part of this time period. This replaces the original recommendation that pregnant women get the vaccine only if they had not previously received it. The [employer] of [employer]ians and Gynecologists and the [employer] support this recommendation.  You should get the whooping cough vaccine while pregnant to pass protection to your baby frame support disabled and/or not supported in this browser  Learn why Vernona Rieger decided to get the whooping cough vaccine in her 3rd trimester of pregnancy and how her baby girl was born with some protection against the disease. Also available on YouTube. After receiving the whooping cough vaccine, your body will create protective antibodies (proteins produced by the body to fight off diseases) and pass some of them to your baby before birth. These antibodies provide your baby some short-term  protection against whooping cough in early life. These antibodies can also protect your baby from some of the more serious complications that come along with whooping cough. Your protective antibodies are at their highest about 2 weeks after getting the vaccine, but it takes time to pass them to your baby. So the preferred time to get the whooping cough vaccine is early in your third trimester. The amount of whooping cough antibodies in your body decreases over time. That is why CDC recommends you get a whooping cough vaccine during each pregnancy. Doing so allows each of your babies to get the greatest number of protective antibodies from you. This means each of your babies will get the best protection possible against this disease.  Getting the whooping cough vaccine while pregnant is better than getting the vaccine after you give birth Whooping cough vaccination during pregnancy is ideal so your baby will have short-term protection as soon as he is born. This early protection is important because your baby will not start getting his whooping cough vaccines until he is 2 months old. These first few months of life are when your baby is at greatest risk for catching whooping cough. This is also when he's at greatest risk for having severe, potentially life-threating complications from the infection. To avoid that gap in protection, it is best to get a whooping cough vaccine during pregnancy. You will then pass protection to your baby before he is born. To continue protecting your baby, he should get whooping cough vaccines starting at 2 months old. You may never have gotten the Tdap vaccine before and did not get it during this pregnancy. If so, you should make sure  to get the vaccine immediately after you give birth, before leaving the hospital or birthing center. It will take about 2 weeks before your body develops protection (antibodies) in response to the vaccine. Once you have protection from the vaccine,  you are less likely to give whooping cough to your newborn while caring for him. But remember, your baby will still be at risk for catching whooping cough from others. A recent study looked to see how effective Tdap was at preventing whooping cough in babies whose mothers got the vaccine while pregnant or in the hospital after giving birth. The study found that getting Tdap between 27 through 36 weeks of pregnancy is 85% more effective at preventing whooping cough in babies younger than 2 months old. Blood tests cannot tell if you need a whooping cough vaccine There are no blood tests that can tell you if you have enough antibodies in your body to protect yourself or your baby against whooping cough. Even if you have been sick with whooping cough in the past or previously received the vaccine, you still should get the vaccine during each pregnancy. Breastfeeding may pass some protective antibodies onto your baby By breastfeeding, you may pass some antibodies you have made in response to the vaccine to your baby. When you get a whooping cough vaccine during your pregnancy, you will have antibodies in your breast milk that you can share with your baby as soon as your milk comes in. However, your baby will not get protective antibodies immediately if you wait to get the whooping cough vaccine until after delivering your baby. This is because it takes about 2 weeks for your body to create antibodies. Learn more about the health benefits of breastfeeding.

## 2024-02-02 NOTE — Progress Notes (Signed)
 PRENATAL VISIT NOTE  Subjective:  Tammy Welch is a 33 y.o. G2P1001 at [redacted]w[redacted]d being seen today for ongoing prenatal care.  She is currently monitored for the following issues for this low-risk pregnancy and has Anxiety; Depressive disorder; Supervision of other normal pregnancy, antepartum; Urinary tract infection in mother during first trimester of pregnancy; Polyhydramnios in third trimester; Hx of postpartum depression, currently pregnant; and Request for sterilization on their problem list.  Patient reports some abdominal heaviness due to increased fluid.  Contractions: Not present. Vag. Bleeding: None.  Movement: Present. Denies leaking of fluid.   The following portions of the patient's history were reviewed and updated as appropriate: allergies, current medications, past family history, past medical history, past social history, past surgical history and problem list.   Objective:   Vitals:   02/02/24 1430  BP: 97/67  Pulse: 85  Weight: 201 lb (91.2 kg)    Fetal Status:     Movement: Present     General:  Alert, oriented and cooperative. Patient is in no acute distress.  Skin: Skin is warm and dry. No rash noted.   Cardiovascular: Normal heart rate noted  Respiratory: Normal respiratory effort, no problems with respiration noted  Abdomen: Soft, gravid, appropriate for gestational age.  Pain/Pressure: Absent     Pelvic: Cervical exam deferred        Extremities: Normal range of motion.  Edema: None  Mental Status: Normal mood and affect. Normal behavior. Normal judgment and thought content.   [place] MFM OB FOLLOW UP Result Date: 01/21/2024 ----------------------------------------------------------------------  OBSTETRICS REPORT                       (Signed Final 01/21/2024 04:45 pm) ---------------------------------------------------------------------- Patient Info  ID #:       [id number]                          D.O.B.:  09-14-1991 (32 yrs)(F)  Name:       Tammy Welch [facility]               Visit Date: 01/21/2024 04:20 pm ---------------------------------------------------------------------- Performed By  Attending:        Braxton Feathers DO       Ref. Address:     [address]                                                             [address]                                                             [place], [place]  [address]  Performed By:     Earley Brooke     [facility]                    BS, RDMS                                 [facility]                                                             [facility]  Referred By:      [facility]                    [facility]                    [place] ---------------------------------------------------------------------- Orders  #  Description                           Code        Ordered By  1  [place] MFM OB FOLLOW UP                   60454.09    Braxton Feathers ----------------------------------------------------------------------  #  Order #                     Accession #                Episode #  1  [id number]                   [id number]                 [id number] ---------------------------------------------------------------------- Indications  [redacted] weeks gestation of pregnancy                Z3A.28  Obesity complicating pregnancy, second         O99.212  trimester (pregravid BMI 33)  Antenatal screening for malformations          Z36.3  Polyhydramnios, second trimester,              O40.2XX0  antepartum condition or complication, fetus  unspecified  Low risk NIPS, neg Horizon ---------------------------------------------------------------------- Fetal Evaluation  Num Of Fetuses:         1  Fetal Heart Rate(bpm):  135  Cardiac Activity:       Observed  Presentation:           Cephalic  Placenta:                Anterior  P. Cord Insertion:      Previously seen  Amniotic Fluid  AFI FV:      Polyhydramnios  AFI Sum(cm)     %Tile  Largest Pocket(cm)  24.91           97          8.01  RUQ(cm)       RLQ(cm)       LUQ(cm)        LLQ(cm)  7.67          5.33          8.01           3.9 ---------------------------------------------------------------------- Biometry  BPD:      72.7  mm     G. Age:  29w 1d         67  %    CI:        77.25   %    70 - 86                                                          FL/HC:      18.7   %    18.8 - 20.6  HC:      261.9  mm     G. Age:  28w 3d         25  %    HC/AC:      1.10        1.05 - 1.21  AC:      238.4  mm     G. Age:  28w 1d         38  %    FL/BPD:     67.4   %    71 - 87  FL:         49  mm     G. Age:  26w 3d        3.4  %    FL/AC:      20.6   %    20 - 24  Est. FW:    1110  gm      2 lb 7 oz     18  % ---------------------------------------------------------------------- OB History  Gravidity:    2         Term:   1        Prem:   0        SAB:   0  TOP:          0       Ectopic:  0        Living: 1 ---------------------------------------------------------------------- Gestational Age  LMP:           28w 2d        Date:  07/07/23                  EDD:   04/12/24  U/S Today:     28w 0d                                        EDD:   04/14/24  Best:          28w 2d     Det. By:  LMP  (07/07/23)          EDD:   04/12/24 ---------------------------------------------------------------------- Anatomy  Ventricles:  Appears normal         Stomach:                Appears normal, left                                                                        sided  Heart:                 Appears normal         Kidneys:                Appear normal                         (4CH, axis, and                         situs)  Diaphragm:             Appears normal         Bladder:                Appears normal ---------------------------------------------------------------------- Cervix  Uterus Adnexa  Cervix  Not visualized (advanced GA >24wks) ---------------------------------------------------------------------- Comments  Tammy Welch has a pregnancy with the  complications mentioned in the problem list. During today's  visit we focused on the following concerns:  RE polyhydramnios: There is continued polyhydramnios on  today's ultrasound.  The patient passed her Glucola.  I  discussed the presence of polyhydramnios is currently  idiopathic but may represent a fetal gastrointestinal problem  such as atresia or volvulus or tracheoesophageal fistula,  which can be difficult to diagnose prenatally and further  ultrasounds are needed to monitor.  Sonographic findings  Single intrauterine pregnancy at 28w 2d.  Fetal cardiac activity:  Observed and appears normal.  Presentation: Cephalic.  Interval fetal anatomy appears normal.  Fetal biometry shows the estimated fetal weight at the 18  percentile.  Amniotic fluid volume: Polyhydramnios. MVP: 8.01 cm.  Placenta: Anterior.  There are limitations of prenatal ultrasound such as the  inability to detect certain abnormalities due to poor  visualization. Various factors such as fetal position,  gestational age and maternal body habitus may increase the  difficulty in visualizing the fetal anatomy.  Recommendations  -F/u US in 4 weeks  - Antenatal testing reserved for moderate poly (AFI >30 cm)  at greater than 32 weeks.  -Continue routine prenatal care with referring OB provider ----------------------------------------------------------------------                 Braxton Feathers, DO Electronically Signed Final Report   01/21/2024 04:45 pm ----------------------------------------------------------------------    Assessment and Plan:  Pregnancy: G2P1001 at [redacted]w[redacted]d 1. Polyhydramnios in third trimester complication, single or unspecified fetus (Primary) Continue scans as per MFM.  Normal 2 hr GTT.  2. Request for sterilization Discussed permanence of  method, other alternatives. She wants this.  Declines other LARCs or other methods. Medicaid papers signed.   3. [redacted] weeks gestation of pregnancy 4. Supervision of other normal pregnancy, antepartum No other concerns. Preterm labor symptoms and general obstetric precautions including but not limited to vaginal bleeding, contractions, leaking of fluid and fetal movement were  reviewed in detail with the patient. Please refer to After Visit Summary for other counseling recommendations.   Return in about 2 weeks (around 02/16/2024) for OFFICE OB VISIT (MD or APP).  Future Appointments  Date Time Provider Department Center  02/19/2024  3:00 PM [facility] PROVIDER 1 [facility]  02/19/2024  3:30 PM [facility] US4 [facility]    Lenoard Rad, MD

## 2024-02-16 ENCOUNTER — Encounter (HOSPITAL_BASED_OUTPATIENT_CLINIC_OR_DEPARTMENT_OTHER): Payer: Medicaid Other | Admitting: Obstetrics & Gynecology

## 2024-02-16 ENCOUNTER — Ambulatory Visit: Admitting: Obstetrics & Gynecology

## 2024-02-16 VITALS — BP 94/64 | HR 101 | Wt 203.0 lb

## 2024-02-16 DIAGNOSIS — Z3480 Encounter for supervision of other normal pregnancy, unspecified trimester: Secondary | ICD-10-CM

## 2024-02-16 DIAGNOSIS — Z3A32 32 weeks gestation of pregnancy: Secondary | ICD-10-CM

## 2024-02-16 DIAGNOSIS — Z8659 Personal history of other mental and behavioral disorders: Secondary | ICD-10-CM | POA: Diagnosis not present

## 2024-02-16 DIAGNOSIS — O99891 Other specified diseases and conditions complicating pregnancy: Secondary | ICD-10-CM

## 2024-02-16 NOTE — Progress Notes (Unsigned)
   PRENATAL VISIT NOTE  Subjective:  Tammy Welch is a 33 y.o. G2P1001 at [redacted]w[redacted]d being seen today for ongoing prenatal care.  She is currently monitored for the following issues for this low-risk pregnancy and has Anxiety; Depressive disorder; Supervision of other normal pregnancy, antepartum; Urinary tract infection in mother during first trimester of pregnancy; Polyhydramnios in third trimester; Hx of postpartum depression, currently pregnant; and Request for sterilization on their problem list.  Patient reports no complaints.  Contractions: Not present. Vag. Bleeding: None.  Movement: Present. Denies leaking of fluid.   The following portions of the patient's history were reviewed and updated as appropriate: allergies, current medications, past family history, past medical history, past social history, past surgical history and problem list.   Objective:   Vitals:   02/16/24 1502  BP: 94/64  Pulse: (!) 101  Weight: 203 lb (92.1 kg)    Fetal Status: Fetal Heart Rate (bpm): 138   Movement: Present     General:  Alert, oriented and cooperative. Patient is in no acute distress.  Skin: Skin is warm and dry. No rash noted.   Cardiovascular: Normal heart rate noted  Respiratory: Normal respiratory effort, no problems with respiration noted  Abdomen: Soft, gravid, appropriate for gestational age.  Pain/Pressure: Present     Pelvic: No pelvic exam was performed at this visit.        Extremities: Normal range of motion.  Edema: None  Mental Status: Normal mood and affect. Normal behavior. Normal judgment and thought content.   Assessment and Plan:  Pregnancy: G2P1001 at [redacted]w[redacted]d 1. Supervision of other normal pregnancy, antepartum (Primary) - Continue routine prenatal care.   2. Hx of postpartum depression, currently pregnant - Monitor mood closely during hormonal transitions and continue to screen for depression and anxiety as indicated.  3. [redacted] weeks gestation of  pregnancy  Preterm labor symptoms and general obstetric precautions including but not limited to vaginal bleeding, contractions, leaking of fluid and fetal movement were reviewed in detail with the patient. Please refer to After Visit Summary for other counseling recommendations.   Return in about 2 weeks (around 03/01/2024).  Future Appointments  Date Time Provider Department Center  02/19/2024  3:00 PM [facility] PROVIDER 1 [facility]  02/19/2024  3:30 PM [facility] US4 [facility]  03/02/2024  2:50 PM Tresia Fruit, MD [facility] None  03/16/2024  2:50 PM Tresia Fruit, MD [facility] None    Kathryn Parish, Medical Student

## 2024-02-18 ENCOUNTER — Encounter (HOSPITAL_BASED_OUTPATIENT_CLINIC_OR_DEPARTMENT_OTHER): Payer: Medicaid Other | Admitting: Obstetrics & Gynecology

## 2024-02-19 ENCOUNTER — Ambulatory Visit: Admitting: Maternal & Fetal Medicine

## 2024-02-19 ENCOUNTER — Other Ambulatory Visit: Payer: Self-pay | Admitting: *Deleted

## 2024-02-19 ENCOUNTER — Ambulatory Visit: Attending: Obstetrics and Gynecology

## 2024-02-19 VITALS — BP 116/57 | HR 86

## 2024-02-19 DIAGNOSIS — Z3A32 32 weeks gestation of pregnancy: Secondary | ICD-10-CM

## 2024-02-19 DIAGNOSIS — O99213 Obesity complicating pregnancy, third trimester: Secondary | ICD-10-CM

## 2024-02-19 DIAGNOSIS — O403XX0 Polyhydramnios, third trimester, not applicable or unspecified: Secondary | ICD-10-CM | POA: Diagnosis present

## 2024-02-19 DIAGNOSIS — O402XX0 Polyhydramnios, second trimester, not applicable or unspecified: Secondary | ICD-10-CM | POA: Diagnosis present

## 2024-02-19 DIAGNOSIS — E669 Obesity, unspecified: Secondary | ICD-10-CM

## 2024-02-19 DIAGNOSIS — O321XX0 Maternal care for breech presentation, not applicable or unspecified: Secondary | ICD-10-CM

## 2024-02-19 NOTE — Progress Notes (Signed)
 After review, MFM consult with provider is not indicated for today  Kristi Petties, MD 02/19/2024 5:23 PM  [facility]

## 2024-03-01 ENCOUNTER — Encounter (HOSPITAL_BASED_OUTPATIENT_CLINIC_OR_DEPARTMENT_OTHER): Payer: Medicaid Other | Admitting: Certified Nurse Midwife

## 2024-03-02 ENCOUNTER — Ambulatory Visit: Admitting: Obstetrics & Gynecology

## 2024-03-02 VITALS — BP 95/65 | HR 106 | Wt 208.0 lb

## 2024-03-02 DIAGNOSIS — O99891 Other specified diseases and conditions complicating pregnancy: Secondary | ICD-10-CM

## 2024-03-02 DIAGNOSIS — Z8659 Personal history of other mental and behavioral disorders: Secondary | ICD-10-CM | POA: Diagnosis not present

## 2024-03-02 DIAGNOSIS — L739 Follicular disorder, unspecified: Secondary | ICD-10-CM | POA: Diagnosis not present

## 2024-03-02 DIAGNOSIS — Z3480 Encounter for supervision of other normal pregnancy, unspecified trimester: Secondary | ICD-10-CM | POA: Diagnosis not present

## 2024-03-02 MED ORDER — AMOXICILLIN-POT CLAVULANATE 875-125 MG PO TABS: 1.0000 | ORAL_TABLET | Freq: Two times a day (BID) | ORAL | 1 refills | Status: DC

## 2024-03-02 NOTE — Progress Notes (Signed)
   PRENATAL VISIT NOTE  Subjective:  Tammy Welch is a 33 y.o. G2P1001 at [redacted]w[redacted]d being seen today for ongoing prenatal care.  She is currently monitored for the following issues for this low-risk pregnancy and has Anxiety; Depressive disorder; Supervision of other normal pregnancy, antepartum; Urinary tract infection in mother during first trimester of pregnancy; Polyhydramnios in third trimester; Hx of postpartum depression, currently pregnant; and Request for sterilization on their problem list.  Patient reports no complaints.  Contractions: Not present. Vag. Bleeding: None.  Movement: Present. Denies leaking of fluid.   The following portions of the patient's history were reviewed and updated as appropriate: allergies, current medications, past family history, past medical history, past social history, past surgical history and problem list.  Pelvic pressure and boil on right buttock is uncomfortable Objective:   Vitals:   03/02/24 1446  BP: 95/65  Pulse: (!) 106  Weight: 208 lb (94.3 kg)     Fetal Status: Fetal Heart Rate (bpm): 135   Movement: Present      General: Alert, oriented and cooperative. Patient is in no acute distress.  Skin: Skin is warm and dry. No rash noted.   Cardiovascular: Normal heart rate noted  Respiratory: Normal respiratory effort, no problems with respiration noted  Abdomen: Soft, gravid, appropriate for gestational age.  Pain/Pressure: Present     Pelvic: Cervical exam deferred      Follicular abscess right inner buttock1 cm no swelling or redness  Extremities: Normal range of motion.  Edema: None  Mental Status: Normal mood and affect. Normal behavior. Normal judgment and thought content.   Assessment and Plan:  Pregnancy: G2P1001 at [redacted]w[redacted]d 1. Supervision of other normal pregnancy, antepartum (Primary) Polyhydramnios resolved  2. Hx of postpartum depression, currently pregnant   3. Folliculitis Rx given - amoxicillin -clavulanate (AUGMENTIN)  875-125 MG tablet; Take 1 tablet by mouth 2 (two) times daily.  Dispense: 20 tablet; Refill: 1  Preterm labor symptoms and general obstetric precautions including but not limited to vaginal bleeding, contractions, leaking of fluid and fetal movement were reviewed in detail with the patient. Please refer to After Visit Summary for other counseling recommendations.   Return in about 2 weeks (around 03/16/2024).  Future Appointments  Date Time Provider Department Center  03/16/2024  2:50 PM Tresia Fruit, MD [facility] None  03/23/2024 10:00 AM [facility] PROVIDER 1 [facility]  03/23/2024 10:30 AM [facility] US4 [facility]    Onnie Bilis, MD

## 2024-03-02 NOTE — Progress Notes (Signed)
 ROB, c/o pelvic/vaginal pain

## 2024-03-16 ENCOUNTER — Encounter (HOSPITAL_BASED_OUTPATIENT_CLINIC_OR_DEPARTMENT_OTHER): Payer: Medicaid Other | Admitting: Obstetrics & Gynecology

## 2024-03-16 ENCOUNTER — Encounter: Payer: Self-pay | Admitting: Obstetrics & Gynecology

## 2024-03-16 ENCOUNTER — Ambulatory Visit: Payer: Self-pay | Admitting: Obstetrics & Gynecology

## 2024-03-16 ENCOUNTER — Other Ambulatory Visit (HOSPITAL_COMMUNITY)
Admission: RE | Admit: 2024-03-16 | Discharge: 2024-03-16 | Disposition: A | Discharge status: Home / Self Care | Source: Ambulatory Visit | Attending: Obstetrics & Gynecology | Admitting: Obstetrics & Gynecology

## 2024-03-16 VITALS — BP 118/76 | HR 107 | Wt 207.3 lb

## 2024-03-16 DIAGNOSIS — Z3A36 36 weeks gestation of pregnancy: Secondary | ICD-10-CM | POA: Diagnosis not present

## 2024-03-16 DIAGNOSIS — Z3480 Encounter for supervision of other normal pregnancy, unspecified trimester: Secondary | ICD-10-CM | POA: Diagnosis present

## 2024-03-16 DIAGNOSIS — O403XX0 Polyhydramnios, third trimester, not applicable or unspecified: Secondary | ICD-10-CM

## 2024-03-16 NOTE — Addendum Note (Signed)
 Addended by: Tresia Fruit on: 03/16/2024 03:27 PM   Modules accepted: Orders

## 2024-03-16 NOTE — Progress Notes (Signed)
   PRENATAL VISIT NOTE  Subjective:  Tammy Welch is a 33 y.o. G2P1001 at [redacted]w[redacted]d being seen today for ongoing prenatal care.  She is currently monitored for the following issues for this low-risk pregnancy and has Anxiety; Depressive disorder; Supervision of other normal pregnancy, antepartum; Urinary tract infection in mother during first trimester of pregnancy; Hx of postpartum depression, currently pregnant; and Request for sterilization on their problem list.  Patient reports occasional contractions and pelvic pressure.  Contractions: Not present. Vag. Bleeding: None.  Movement: Present. Denies leaking of fluid.   The following portions of the patient's history were reviewed and updated as appropriate: allergies, current medications, past family history, past medical history, past social history, past surgical history and problem list.   Objective:    Vitals:   03/16/24 1453  BP: 118/76  Pulse: (!) 107  Weight: 207 lb 4.8 oz (94 kg)    Fetal Status:  Fetal Heart Rate (bpm): 159   Movement: Present Presentation: Vertex  General: Alert, oriented and cooperative. Patient is in no acute distress.  Skin: Skin is warm and dry. No rash noted.   Cardiovascular: Normal heart rate noted  Respiratory: Normal respiratory effort, no problems with respiration noted  Abdomen: Soft, gravid, appropriate for gestational age.  Pain/Pressure: Present     Pelvic: Cervical exam performed in the presence of a chaperone Dilation: Fingertip Effacement (%): 30 Station: Ballotable  Extremities: Normal range of motion.  Edema: Trace  Mental Status: Normal mood and affect. Normal behavior. Normal judgment and thought content.   Assessment and Plan:  Pregnancy: G2P1001 at [redacted]w[redacted]d 1. Supervision of other normal pregnancy, antepartum (Primary)   2. Polyhydramnios in third trimester complication, single or unspecified fetus Resolved, US  scheduled tomorrow  3. [redacted] weeks gestation of pregnancy Orders  Placed This Encounter  Procedures   Culture, beta strep (group b only)     Preterm labor symptoms and general obstetric precautions including but not limited to vaginal bleeding, contractions, leaking of fluid and fetal movement were reviewed in detail with the patient. Please refer to After Visit Summary for other counseling recommendations.   Return in about 1 week (around 03/23/2024).  Future Appointments  Date Time Provider Department Center  03/17/2024  1:00 PM [facility] PROVIDER 1 [facility]  03/17/2024  1:30 PM [facility] US3 [facility]    Onnie Bilis, MD

## 2024-03-16 NOTE — Progress Notes (Signed)
 Pain and pressure in pubic area (scale of 9) occurring approx 2 months "on and off".   No other concerns at this time.

## 2024-03-17 ENCOUNTER — Ambulatory Visit: Attending: Obstetrics and Gynecology | Admitting: Obstetrics and Gynecology

## 2024-03-17 ENCOUNTER — Ambulatory Visit

## 2024-03-17 VITALS — BP 104/63

## 2024-03-17 DIAGNOSIS — O321XX0 Maternal care for breech presentation, not applicable or unspecified: Secondary | ICD-10-CM

## 2024-03-17 DIAGNOSIS — O403XX0 Polyhydramnios, third trimester, not applicable or unspecified: Secondary | ICD-10-CM | POA: Diagnosis not present

## 2024-03-17 DIAGNOSIS — O99213 Obesity complicating pregnancy, third trimester: Secondary | ICD-10-CM | POA: Diagnosis present

## 2024-03-17 DIAGNOSIS — Z362 Encounter for other antenatal screening follow-up: Secondary | ICD-10-CM | POA: Insufficient documentation

## 2024-03-17 DIAGNOSIS — E669 Obesity, unspecified: Secondary | ICD-10-CM | POA: Diagnosis not present

## 2024-03-17 DIAGNOSIS — O409XX0 Polyhydramnios, unspecified trimester, not applicable or unspecified: Secondary | ICD-10-CM

## 2024-03-17 DIAGNOSIS — Z3A36 36 weeks gestation of pregnancy: Secondary | ICD-10-CM

## 2024-03-17 LAB — CERVICOVAGINAL ANCILLARY ONLY
Bacterial Vaginitis (gardnerella): NEGATIVE
Candida Glabrata: NEGATIVE
Candida Vaginitis: POSITIVE — AB
Chlamydia: NEGATIVE
Comment: NEGATIVE
Comment: NEGATIVE
Comment: NEGATIVE
Comment: NEGATIVE
Comment: NEGATIVE
Comment: NORMAL
Neisseria Gonorrhea: NEGATIVE
Trichomonas: NEGATIVE

## 2024-03-17 NOTE — Progress Notes (Signed)
 Maternal-Fetal Medicine Consultation Name: Tammy Welch MRN: [id number]  G2 P1001 at 36w 2d gestation.  Patient returned for fetal growth assessment.  Previously seen polyhydramnios had resolved at last ultrasound.  The estimated fetal weight was at the 13th percentile. She does not have gestational diabetes.  Blood pressure today at our office is 104/63 mmHg.  Ultrasound Fetal growth is appropriate for gestational age.  The estimated fetal weight is at the 14th percentile.  Femur length measurement is at the 1st percentile but interval growth is seen. Frank breech presentation.  Breech presentation and External Cephalic Version -I counseled the patient on malpresentation and that in most cases it will spontaneously revert to cephalic presentation. -If spontaneous version does not occur by 36 at [redacted] weeks gestation, external cephalic version can be performed.  I briefly explained the procedure. Possible complication include nonreassuring fetal heart trace, rupture of membranes, fetomaternal hemorrhage, and abruption that may require emergency cesarean delivery (about 1%).  -Success rate of ECV is about 50%. -ECV can be performed later at [redacted] weeks gestation under epidural analgesia.  If successful, the patient can await spontaneous labor or induction of labor can be carried out after the procedure. I counseled the patient that ECB reduces the likelihood of cesarean delivery and its complications. Patient will discuss with her provider at her next prenatal visit. Patient's blood type is AB positive.  Recommendations -Patient to discuss ECV at her prenatal visit. -No follow-up appointments were made.  Consultation including face-to-face (more than 50%) counseling 20 minutes.

## 2024-03-20 LAB — CULTURE, BETA STREP (GROUP B ONLY): Strep Gp B Culture: NEGATIVE

## 2024-03-22 ENCOUNTER — Encounter (HOSPITAL_BASED_OUTPATIENT_CLINIC_OR_DEPARTMENT_OTHER): Payer: Medicaid Other | Admitting: Certified Nurse Midwife

## 2024-03-23 ENCOUNTER — Ambulatory Visit

## 2024-03-23 ENCOUNTER — Ambulatory Visit (INDEPENDENT_AMBULATORY_CARE_PROVIDER_SITE_OTHER): Admitting: Obstetrics and Gynecology

## 2024-03-23 VITALS — BP 123/72 | HR 102 | Wt 212.1 lb

## 2024-03-23 DIAGNOSIS — O99891 Other specified diseases and conditions complicating pregnancy: Secondary | ICD-10-CM

## 2024-03-23 DIAGNOSIS — O329XX0 Maternal care for malpresentation of fetus, unspecified, not applicable or unspecified: Secondary | ICD-10-CM

## 2024-03-23 DIAGNOSIS — Z8659 Personal history of other mental and behavioral disorders: Secondary | ICD-10-CM

## 2024-03-23 DIAGNOSIS — Z302 Encounter for sterilization: Secondary | ICD-10-CM

## 2024-03-23 DIAGNOSIS — Z3480 Encounter for supervision of other normal pregnancy, unspecified trimester: Secondary | ICD-10-CM

## 2024-03-23 DIAGNOSIS — Z3A37 37 weeks gestation of pregnancy: Secondary | ICD-10-CM

## 2024-03-23 NOTE — Progress Notes (Signed)
   PRENATAL VISIT NOTE  Subjective:  Tammy Welch is a 33 y.o. G2P1001 at [redacted]w[redacted]d being seen today for ongoing prenatal care.  She is currently monitored for the following issues for this low-risk pregnancy and has Anxiety; Depressive disorder; Supervision of other normal pregnancy, antepartum; Urinary tract infection in mother during first trimester of pregnancy; Hx of postpartum depression, currently pregnant; Request for sterilization; and Malpresentation before onset of labor on their problem list.  Patient reports doing ok overall.  Contractions: Not present. Vag. Bleeding: None.  Movement: Present. Denies leaking of fluid.   The following portions of the patient's history were reviewed and updated as appropriate: allergies, current medications, past family history, past medical history, past social history, past surgical history and problem list.   Objective:   Vitals:   03/23/24 1453  BP: 123/72  Pulse: (!) 102  Weight: 212 lb 1.6 oz (96.2 kg)    Fetal Status: Fetal Heart Rate (bpm): 150   Movement: Present     General:  Alert, oriented and cooperative. Patient is in no acute distress.  Skin: Skin is warm and dry. No rash noted.   Cardiovascular: Normal heart rate noted  Respiratory: Normal respiratory effort, no problems with respiration noted  Abdomen: Soft, gravid, appropriate for gestational age.  Pain/Pressure: Present      Assessment and Plan:  Pregnancy: G2P1001 at [redacted]w[redacted]d 1. Supervision of other normal pregnancy, antepartum (Primary) GBS neg  2. Malpresentation before onset of labor, single or unspecified fetus Rpt US  5/28 for polyhydramnios showed resolution of poly, but persistent breech presentation. Bedside US  confirms breech presentation today Discussed options: ECV, CS, breech vaginal delivery  - ECV: recommend attempt in the 37th week as smaller size may facilitate successful version. She is a multip with AFI 20 which may help us  be successful. Her BMI is 38  and placenta is anterior which may make version more challenging. She is worried about pain. Discussed option for neuraxial anesthesia. Reviewed risks of procedure include fetal intolerance, PROM, unsuccessful version.   - Primary CS: reviewed usual surgical risks, longer recovery time  - Vaginal breech delivery: would need to be planned with experienced provider, reviewed not all providers are comfortable   After discussed, she would like ECV with neuraxial at 39 weeks with same day IOL if successful or CS w/ BTL if unsuccessful. She declines an earlier attempt at ECV and understands bigger baby will be harder to turn.   3. Request for sterilization Papers signed 02/02/24 Discussed back up options if sterilization is not possible this hospitalization - Nexplanon can be placed in hospital and mini pill can be prescribed. Could plan for interval methods. She does not have a partner so vasectomy is not an option.   4. Hx of postpartum depression, currently pregnant No mood complaints today  Please refer to After Visit Summary for other counseling recommendations.   Return in about 1 week (around 03/30/2024) for ROB at 38 weeks - MD only.  Future Appointments  Date Time Provider Department Center  04/01/2024  3:50 PM Gabrielle Joiner, MD [facility] None   Izell Marsh, MD

## 2024-03-23 NOTE — Progress Notes (Signed)
 Pt presents for rob. Pt has no questions or concerns at this time.

## 2024-03-24 ENCOUNTER — Encounter (HOSPITAL_BASED_OUTPATIENT_CLINIC_OR_DEPARTMENT_OTHER): Payer: Self-pay

## 2024-03-24 NOTE — Patient Instructions (Signed)
 Tammy Welch  03/24/2024   Your procedure is scheduled on:  04/06/2024  Arrive at 1200 at Entrance C on [address] at [facility]  and [employer]. You are invited to use the FREE valet parking or use the Visitor's parking deck.  Pick up the phone at the desk and dial [phone].  Call this number if you have problems the morning of surgery: [phone]  Remember:   Do not eat food:(After Midnight) Desps de medianoche.  You may drink clear liquids until  __1000___.  Clear liquids means a liquid you can see thru.  It can have color such as Cola or Kool aid.  Tea is OK and coffee as long as no milk or creamer of any kind.  Take these medicines the morning of surgery with A SIP OF WATER:  none   Do not wear jewelry, make-up or nail polish.  Do not wear lotions, powders, or perfumes. Do not wear deodorant.  Do not shave 48 hours prior to surgery.  Do not bring valuables to the hospital.  [facility] is not   responsible for any belongings or valuables brought to the hospital.  Contacts, dentures or bridgework may not be worn into surgery.  Leave suitcase in the car. After surgery it may be brought to your room.  For patients admitted to the hospital, checkout time is 11:00 AM the day of              discharge.      Please read over the following fact sheets that you were given:     Preparing for Surgery

## 2024-03-29 ENCOUNTER — Encounter (HOSPITAL_COMMUNITY): Payer: Self-pay

## 2024-03-29 ENCOUNTER — Encounter (HOSPITAL_BASED_OUTPATIENT_CLINIC_OR_DEPARTMENT_OTHER): Payer: Medicaid Other | Admitting: Obstetrics & Gynecology

## 2024-03-30 ENCOUNTER — Encounter: Admitting: Obstetrics and Gynecology

## 2024-03-31 ENCOUNTER — Ambulatory Visit (INDEPENDENT_AMBULATORY_CARE_PROVIDER_SITE_OTHER): Admitting: Obstetrics & Gynecology

## 2024-03-31 VITALS — BP 95/64 | HR 92 | Wt 213.0 lb

## 2024-03-31 DIAGNOSIS — Z3A38 38 weeks gestation of pregnancy: Secondary | ICD-10-CM

## 2024-03-31 DIAGNOSIS — O329XX0 Maternal care for malpresentation of fetus, unspecified, not applicable or unspecified: Secondary | ICD-10-CM

## 2024-03-31 DIAGNOSIS — Z3480 Encounter for supervision of other normal pregnancy, unspecified trimester: Secondary | ICD-10-CM

## 2024-03-31 NOTE — Progress Notes (Signed)
   PRENATAL VISIT NOTE  Subjective:  Tammy Welch is a 33 y.o. G2P1001 at [redacted]w[redacted]d being seen today for ongoing prenatal care.  She is currently monitored for the following issues for this high-risk pregnancy and has Anxiety; Depressive disorder; Supervision of other normal pregnancy, antepartum; Urinary tract infection in mother during first trimester of pregnancy; Hx of postpartum depression, currently pregnant; Request for sterilization; and Malpresentation before onset of labor on their problem list.  Patient reports no complaints.  Contractions: Not present. Vag. Bleeding: None.  Movement: Present. Denies leaking of fluid.   The following portions of the patient's history were reviewed and updated as appropriate: allergies, current medications, past family history, past medical history, past social history, past surgical history and problem list.   Objective:    Vitals:   03/31/24 1523  BP: 95/64  Pulse: 92  Weight: 213 lb (96.6 kg)    Fetal Status:  Fetal Heart Rate (bpm): 152   Movement: Present    General: Alert, oriented and cooperative. Patient is in no acute distress.  Skin: Skin is warm and dry. No rash noted.   Cardiovascular: Normal heart rate noted  Respiratory: Normal respiratory effort, no problems with respiration noted  Abdomen: Soft, gravid, appropriate for gestational age.  Pain/Pressure: Present     Pelvic: Cervical exam deferred        Extremities: Normal range of motion.  Edema: Trace  Mental Status: Normal mood and affect. Normal behavior. Normal judgment and thought content.   Assessment and Plan:  Pregnancy: G2P1001 at [redacted]w[redacted]d 1. Supervision of other normal pregnancy, antepartum (Primary) Portable US  used- breech presentation, to be seen in LD on 6/16  2. Malpresentation before onset of labor, single or unspecified fetus breech  Term labor symptoms and general obstetric precautions including but not limited to vaginal bleeding, contractions, leaking  of fluid and fetal movement were reviewed in detail with the patient. Please refer to After Visit Summary for other counseling recommendations.   Return if symptoms worsen or fail to improve.  Future Appointments  Date Time Provider Department Center  04/05/2024 11:30 AM [facility] PAT 1 [facility] None    Onnie Bilis, MD

## 2024-04-01 ENCOUNTER — Encounter: Admitting: Obstetrics

## 2024-04-05 ENCOUNTER — Other Ambulatory Visit: Payer: Self-pay | Admitting: Family Medicine

## 2024-04-05 ENCOUNTER — Encounter (HOSPITAL_COMMUNITY)
Admission: RE | Admit: 2024-04-05 | Discharge: 2024-04-05 | Disposition: A | Discharge status: Home / Self Care | Source: Ambulatory Visit | Attending: Family Medicine | Admitting: Family Medicine

## 2024-04-05 ENCOUNTER — Encounter (HOSPITAL_COMMUNITY): Payer: Self-pay | Admitting: Anesthesiology

## 2024-04-05 ENCOUNTER — Encounter (HOSPITAL_BASED_OUTPATIENT_CLINIC_OR_DEPARTMENT_OTHER): Payer: Medicaid Other | Admitting: Obstetrics & Gynecology

## 2024-04-05 ENCOUNTER — Encounter (HOSPITAL_BASED_OUTPATIENT_CLINIC_OR_DEPARTMENT_OTHER): Payer: Medicaid Other | Admitting: Certified Nurse Midwife

## 2024-04-05 ENCOUNTER — Encounter (HOSPITAL_COMMUNITY): Payer: Self-pay | Admitting: Family Medicine

## 2024-04-05 DIAGNOSIS — Z01812 Encounter for preprocedural laboratory examination: Secondary | ICD-10-CM | POA: Insufficient documentation

## 2024-04-05 DIAGNOSIS — Z3480 Encounter for supervision of other normal pregnancy, unspecified trimester: Secondary | ICD-10-CM

## 2024-04-05 LAB — TYPE AND SCREEN
ABO/RH(D): AB POS
Antibody Screen: NEGATIVE

## 2024-04-05 LAB — CBC
HCT: 37.2 % (ref 36.0–46.0)
Hemoglobin: 12.6 g/dL (ref 12.0–15.0)
MCH: 29.7 pg (ref 26.0–34.0)
MCHC: 33.9 g/dL (ref 30.0–36.0)
MCV: 87.7 fL (ref 80.0–100.0)
Platelets: 129 10*3/uL — ABNORMAL LOW (ref 150–400)
RBC: 4.24 MIL/uL (ref 3.87–5.11)
RDW: 15.7 % — ABNORMAL HIGH (ref 11.5–15.5)
WBC: 6.7 10*3/uL (ref 4.0–10.5)
nRBC: 0 % (ref 0.0–0.2)

## 2024-04-05 LAB — RPR: RPR Ser Ql: NONREACTIVE

## 2024-04-06 ENCOUNTER — Encounter (HOSPITAL_COMMUNITY): Payer: Self-pay | Admitting: Anesthesiology

## 2024-04-06 ENCOUNTER — Inpatient Hospital Stay (HOSPITAL_COMMUNITY): Admit: 2024-04-06 | Admitting: Family Medicine

## 2024-04-06 ENCOUNTER — Inpatient Hospital Stay (HOSPITAL_COMMUNITY)
Admission: RE | Admit: 2024-04-06 | Discharge: 2024-04-09 | DRG: 785 | Disposition: A | Discharge status: Home / Self Care | Attending: Obstetrics and Gynecology | Admitting: Obstetrics and Gynecology

## 2024-04-06 ENCOUNTER — Encounter (HOSPITAL_COMMUNITY): Payer: Self-pay

## 2024-04-06 ENCOUNTER — Inpatient Hospital Stay (HOSPITAL_COMMUNITY)

## 2024-04-06 ENCOUNTER — Other Ambulatory Visit: Payer: Self-pay

## 2024-04-06 ENCOUNTER — Encounter (HOSPITAL_COMMUNITY)
Admission: RE | Disposition: A | Discharge status: Home / Self Care | Payer: Self-pay | Source: Home / Self Care | Attending: Obstetrics and Gynecology

## 2024-04-06 ENCOUNTER — Encounter (HOSPITAL_COMMUNITY): Payer: Self-pay | Admitting: Family Medicine

## 2024-04-06 ENCOUNTER — Inpatient Hospital Stay (HOSPITAL_COMMUNITY): Admitting: Anesthesiology

## 2024-04-06 DIAGNOSIS — O320XX0 Maternal care for unstable lie, not applicable or unspecified: Secondary | ICD-10-CM | POA: Diagnosis present

## 2024-04-06 DIAGNOSIS — O99214 Obesity complicating childbirth: Secondary | ICD-10-CM | POA: Diagnosis present

## 2024-04-06 DIAGNOSIS — F419 Anxiety disorder, unspecified: Secondary | ICD-10-CM | POA: Diagnosis present

## 2024-04-06 DIAGNOSIS — Z3A39 39 weeks gestation of pregnancy: Secondary | ICD-10-CM

## 2024-04-06 DIAGNOSIS — Z8659 Personal history of other mental and behavioral disorders: Secondary | ICD-10-CM

## 2024-04-06 DIAGNOSIS — O99344 Other mental disorders complicating childbirth: Secondary | ICD-10-CM | POA: Diagnosis present

## 2024-04-06 DIAGNOSIS — Z8249 Family history of ischemic heart disease and other diseases of the circulatory system: Secondary | ICD-10-CM

## 2024-04-06 DIAGNOSIS — Z3480 Encounter for supervision of other normal pregnancy, unspecified trimester: Principal | ICD-10-CM

## 2024-04-06 DIAGNOSIS — F32A Depression, unspecified: Secondary | ICD-10-CM | POA: Diagnosis present

## 2024-04-06 DIAGNOSIS — O321XX0 Maternal care for breech presentation, not applicable or unspecified: Secondary | ICD-10-CM | POA: Diagnosis not present

## 2024-04-06 DIAGNOSIS — Z302 Encounter for sterilization: Secondary | ICD-10-CM

## 2024-04-06 DIAGNOSIS — Z98891 History of uterine scar from previous surgery: Principal | ICD-10-CM

## 2024-04-06 SURGERY — Surgical Case
Anesthesia: Regional

## 2024-04-06 MED ORDER — DIBUCAINE (PERIANAL) 1 % EX OINT: 1.0000 | TOPICAL_OINTMENT | CUTANEOUS | Status: DC | PRN

## 2024-04-06 MED ORDER — FENTANYL CITRATE (PF) 100 MCG/2ML IJ SOLN
INTRAMUSCULAR | Status: AC
  Filled 2024-04-06: qty 2

## 2024-04-06 MED ORDER — FENTANYL CITRATE (PF) 100 MCG/2ML IJ SOLN
INTRAMUSCULAR | Status: DC | PRN
  Administered 2024-04-06 (×2): 100 ug via EPIDURAL

## 2024-04-06 MED ORDER — WITCH HAZEL-GLYCERIN EX PADS: 1.0000 | MEDICATED_PAD | CUTANEOUS | Status: DC | PRN

## 2024-04-06 MED ORDER — ONDANSETRON HCL 4 MG/2ML IJ SOLN: 4.0000 mg | Freq: Once | INTRAMUSCULAR | Status: DC | PRN

## 2024-04-06 MED ORDER — ALBUMIN HUMAN 5 % IV SOLN
INTRAVENOUS | Status: AC
Start: 2024-04-06 — End: 2024-04-06
  Filled 2024-04-06: qty 250

## 2024-04-06 MED ORDER — TERBUTALINE SULFATE 1 MG/ML IJ SOLN
INTRAMUSCULAR | Status: AC
  Filled 2024-04-06: qty 1

## 2024-04-06 MED ORDER — SOD CITRATE-CITRIC ACID 500-334 MG/5ML PO SOLN
30.0000 mL | ORAL | Status: DC | PRN
  Administered 2024-04-06: 30 mL via ORAL
  Filled 2024-04-06: qty 30

## 2024-04-06 MED ORDER — FENTANYL CITRATE (PF) 100 MCG/2ML IJ SOLN
INTRAMUSCULAR | Status: AC
Start: 2024-04-06 — End: 2024-04-06
  Filled 2024-04-06: qty 2

## 2024-04-06 MED ORDER — ACETAMINOPHEN 500 MG PO TABS
1000.0000 mg | ORAL_TABLET | Freq: Four times a day (QID) | ORAL | Status: DC
Start: 2024-04-06 — End: 2024-04-09
  Administered 2024-04-06 – 2024-04-08 (×7): 1000 mg via ORAL
  Filled 2024-04-06 (×8): qty 2

## 2024-04-06 MED ORDER — OXYCODONE HCL 5 MG PO TABS: 5.0000 mg | ORAL_TABLET | Freq: Once | ORAL | Status: DC | PRN

## 2024-04-06 MED ORDER — TERBUTALINE SULFATE 1 MG/ML IJ SOLN
0.2500 mg | Freq: Once | INTRAMUSCULAR | Status: AC
  Administered 2024-04-06: 0.25 mg via SUBCUTANEOUS

## 2024-04-06 MED ORDER — ONDANSETRON HCL 4 MG/2ML IJ SOLN
INTRAMUSCULAR | Status: AC
  Filled 2024-04-06: qty 2

## 2024-04-06 MED ORDER — TRANEXAMIC ACID-NACL 1000-0.7 MG/100ML-% IV SOLN
INTRAVENOUS | Status: DC | PRN
  Administered 2024-04-06: 1000 mg via INTRAVENOUS

## 2024-04-06 MED ORDER — DEXAMETHASONE SODIUM PHOSPHATE 4 MG/ML IJ SOLN
INTRAMUSCULAR | Status: AC
  Filled 2024-04-06: qty 1

## 2024-04-06 MED ORDER — SODIUM CHLORIDE 0.9 % IV SOLN
INTRAVENOUS | Status: DC | PRN
  Administered 2024-04-06: 500 mg via INTRAVENOUS

## 2024-04-06 MED ORDER — PHENYLEPHRINE HCL (PRESSORS) 10 MG/ML IV SOLN
INTRAVENOUS | Status: DC | PRN
  Administered 2024-04-06 (×4): 160 ug via INTRAVENOUS

## 2024-04-06 MED ORDER — ONDANSETRON 4 MG PO TBDP
4.0000 mg | ORAL_TABLET | ORAL | Status: DC | PRN
  Administered 2024-04-06 – 2024-04-07 (×3): 4 mg via ORAL
  Filled 2024-04-06 (×5): qty 1

## 2024-04-06 MED ORDER — TERBUTALINE SULFATE 1 MG/ML IJ SOLN
0.2500 mg | Freq: Once | INTRAMUSCULAR | Status: AC | PRN
  Administered 2024-04-06: 0.25 mg via SUBCUTANEOUS
  Filled 2024-04-06: qty 1

## 2024-04-06 MED ORDER — IBUPROFEN 600 MG PO TABS
600.0000 mg | ORAL_TABLET | Freq: Four times a day (QID) | ORAL | Status: DC
  Administered 2024-04-06 – 2024-04-08 (×8): 600 mg via ORAL
  Filled 2024-04-06 (×8): qty 1

## 2024-04-06 MED ORDER — EPHEDRINE SULFATE-NACL 50-0.9 MG/10ML-% IV SOSY
PREFILLED_SYRINGE | INTRAVENOUS | Status: DC | PRN
  Administered 2024-04-06 (×4): 5 mg via INTRAVENOUS

## 2024-04-06 MED ORDER — ALBUMIN HUMAN 5 % IV SOLN: INTRAVENOUS | Status: DC | PRN

## 2024-04-06 MED ORDER — LACTATED RINGERS IV SOLN: INTRAVENOUS | Status: DC

## 2024-04-06 MED ORDER — BUPIVACAINE HCL (PF) 0.25 % IJ SOLN
INTRAMUSCULAR | Status: DC | PRN
  Administered 2024-04-06: 1.5 mL via INTRATHECAL

## 2024-04-06 MED ORDER — MISOPROSTOL 25 MCG QUARTER TABLET
25.0000 ug | ORAL_TABLET | Freq: Once | ORAL | Status: AC
Start: 2024-04-06 — End: 2024-04-06
  Administered 2024-04-06: 25 ug via VAGINAL
  Filled 2024-04-06: qty 1

## 2024-04-06 MED ORDER — ACETAMINOPHEN 10 MG/ML IV SOLN
INTRAVENOUS | Status: AC
  Filled 2024-04-06: qty 300

## 2024-04-06 MED ORDER — ONDANSETRON HCL 4 MG/2ML IJ SOLN: 4.0000 mg | Freq: Four times a day (QID) | INTRAMUSCULAR | Status: DC | PRN

## 2024-04-06 MED ORDER — OXYCODONE HCL 5 MG/5ML PO SOLN: 5.0000 mg | Freq: Once | ORAL | Status: DC | PRN

## 2024-04-06 MED ORDER — STERILE WATER FOR IRRIGATION IR SOLN
Status: DC | PRN
  Administered 2024-04-06: 1000 mL

## 2024-04-06 MED ORDER — CEFAZOLIN SODIUM-DEXTROSE 2-3 GM-%(50ML) IV SOLR
INTRAVENOUS | Status: DC | PRN
Start: 2024-04-06 — End: 2024-04-06
  Administered 2024-04-06: 2 g via INTRAVENOUS

## 2024-04-06 MED ORDER — MORPHINE SULFATE (PF) 0.5 MG/ML IJ SOLN
INTRAMUSCULAR | Status: DC | PRN
  Administered 2024-04-06: 3 mg via EPIDURAL

## 2024-04-06 MED ORDER — OXYTOCIN-SODIUM CHLORIDE 30-0.9 UT/500ML-% IV SOLN: 2.5000 [IU]/h | INTRAVENOUS | Status: DC

## 2024-04-06 MED ORDER — PHENYLEPHRINE 80 MCG/ML (10ML) SYRINGE FOR IV PUSH (FOR BLOOD PRESSURE SUPPORT)
PREFILLED_SYRINGE | INTRAVENOUS | Status: AC
  Filled 2024-04-06: qty 10

## 2024-04-06 MED ORDER — FENTANYL CITRATE (PF) 100 MCG/2ML IJ SOLN: 25.0000 ug | INTRAMUSCULAR | Status: DC | PRN

## 2024-04-06 MED ORDER — SODIUM CHLORIDE 0.9 % IV SOLN
INTRAVENOUS | Status: AC
  Filled 2024-04-06: qty 5

## 2024-04-06 MED ORDER — SIMETHICONE 80 MG PO CHEW
80.0000 mg | CHEWABLE_TABLET | Freq: Three times a day (TID) | ORAL | Status: DC
Start: 2024-04-07 — End: 2024-04-09
  Administered 2024-04-07 – 2024-04-09 (×7): 80 mg via ORAL
  Filled 2024-04-06 (×7): qty 1

## 2024-04-06 MED ORDER — ALBUMIN HUMAN 5 % IV SOLN
INTRAVENOUS | Status: AC
  Filled 2024-04-06: qty 250

## 2024-04-06 MED ORDER — EPHEDRINE 5 MG/ML INJ
INTRAVENOUS | Status: AC
  Filled 2024-04-06: qty 5

## 2024-04-06 MED ORDER — TRANEXAMIC ACID-NACL 1000-0.7 MG/100ML-% IV SOLN: 1000.0000 mg | Freq: Once | INTRAVENOUS | Status: DC

## 2024-04-06 MED ORDER — ZOLPIDEM TARTRATE 5 MG PO TABS: 5.0000 mg | ORAL_TABLET | Freq: Every evening | ORAL | Status: DC | PRN

## 2024-04-06 MED ORDER — OXYTOCIN BOLUS FROM INFUSION: 333.0000 mL | Freq: Once | INTRAVENOUS | Status: DC

## 2024-04-06 MED ORDER — ACETAMINOPHEN 10 MG/ML IV SOLN: 1000.0000 mg | Freq: Once | INTRAVENOUS | Status: DC | PRN

## 2024-04-06 MED ORDER — ENOXAPARIN SODIUM 40 MG/0.4ML IJ SOSY
40.0000 mg | PREFILLED_SYRINGE | INTRAMUSCULAR | Status: DC
Start: 2024-04-07 — End: 2024-04-09
  Administered 2024-04-07 – 2024-04-09 (×3): 40 mg via SUBCUTANEOUS
  Filled 2024-04-06 (×3): qty 0.4

## 2024-04-06 MED ORDER — ONDANSETRON HCL 4 MG/2ML IJ SOLN
INTRAMUSCULAR | Status: DC | PRN
Start: 2024-04-06 — End: 2024-04-06
  Administered 2024-04-06: 4 mg via INTRAVENOUS

## 2024-04-06 MED ORDER — PRENATAL MULTIVITAMIN CH
1.0000 | ORAL_TABLET | Freq: Every day | ORAL | Status: DC
  Administered 2024-04-07 – 2024-04-08 (×2): 1 via ORAL
  Filled 2024-04-06 (×2): qty 1

## 2024-04-06 MED ORDER — MENTHOL 3 MG MT LOZG: 1.0000 | LOZENGE | OROMUCOSAL | Status: DC | PRN

## 2024-04-06 MED ORDER — COCONUT OIL OIL: 1.0000 | TOPICAL_OIL | Status: DC | PRN

## 2024-04-06 MED ORDER — DIPHENHYDRAMINE HCL 25 MG PO CAPS: 25.0000 mg | ORAL_CAPSULE | Freq: Four times a day (QID) | ORAL | Status: DC | PRN

## 2024-04-06 MED ORDER — LACTATED RINGERS IV SOLN: 500.0000 mL | INTRAVENOUS | Status: DC | PRN

## 2024-04-06 MED ORDER — EPHEDRINE 5 MG/ML INJ
INTRAVENOUS | Status: AC
Start: 2024-04-06 — End: 2024-04-06
  Filled 2024-04-06: qty 5

## 2024-04-06 MED ORDER — METOCLOPRAMIDE HCL 5 MG/ML IJ SOLN
INTRAMUSCULAR | Status: AC
  Filled 2024-04-06: qty 2

## 2024-04-06 MED ORDER — LIDOCAINE-EPINEPHRINE (PF) 2 %-1:200000 IJ SOLN
INTRAMUSCULAR | Status: DC | PRN
  Administered 2024-04-06: 3 mL via EPIDURAL
  Administered 2024-04-06: 5 mL via EPIDURAL
  Administered 2024-04-06: 3 mL via EPIDURAL
  Administered 2024-04-06: 4 mL via EPIDURAL
  Administered 2024-04-06: 3 mL via EPIDURAL
  Administered 2024-04-06: 2 mL via EPIDURAL

## 2024-04-06 MED ORDER — MEASLES, MUMPS & RUBELLA VAC IJ SOLR: 0.5000 mL | Freq: Once | INTRAMUSCULAR | Status: DC

## 2024-04-06 MED ORDER — OXYTOCIN-SODIUM CHLORIDE 30-0.9 UT/500ML-% IV SOLN
INTRAVENOUS | Status: DC | PRN
  Administered 2024-04-06: 300 mL via INTRAVENOUS

## 2024-04-06 MED ORDER — OXYCODONE HCL 5 MG PO TABS
5.0000 mg | ORAL_TABLET | ORAL | Status: DC | PRN
  Administered 2024-04-07 (×2): 5 mg via ORAL
  Filled 2024-04-06 (×2): qty 1

## 2024-04-06 MED ORDER — ACETAMINOPHEN 325 MG PO TABS
650.0000 mg | ORAL_TABLET | ORAL | Status: DC | PRN
Start: 2024-04-06 — End: 2024-04-06

## 2024-04-06 MED ORDER — LIDOCAINE HCL (PF) 1 % IJ SOLN: 30.0000 mL | INTRAMUSCULAR | Status: DC | PRN

## 2024-04-06 MED ORDER — SODIUM CHLORIDE 0.9 % IR SOLN
Status: DC | PRN
  Administered 2024-04-06: 1

## 2024-04-06 MED ORDER — CEFAZOLIN SODIUM-DEXTROSE 2-4 GM/100ML-% IV SOLN: 2.0000 g | INTRAVENOUS | Status: DC

## 2024-04-06 MED ORDER — SIMETHICONE 80 MG PO CHEW: 80.0000 mg | CHEWABLE_TABLET | ORAL | Status: DC | PRN

## 2024-04-06 MED ORDER — OXYTOCIN-SODIUM CHLORIDE 30-0.9 UT/500ML-% IV SOLN
INTRAVENOUS | Status: AC
  Filled 2024-04-06: qty 500

## 2024-04-06 MED ORDER — SENNOSIDES-DOCUSATE SODIUM 8.6-50 MG PO TABS
2.0000 | ORAL_TABLET | ORAL | Status: DC
Start: 2024-04-07 — End: 2024-04-09
  Administered 2024-04-07 – 2024-04-09 (×3): 2 via ORAL
  Filled 2024-04-06 (×3): qty 2

## 2024-04-06 MED ORDER — MORPHINE SULFATE (PF) 0.5 MG/ML IJ SOLN
INTRAMUSCULAR | Status: AC
  Filled 2024-04-06: qty 10

## 2024-04-06 MED ORDER — MISOPROSTOL 50MCG HALF TABLET
50.0000 ug | ORAL_TABLET | Freq: Once | ORAL | Status: AC
Start: 2024-04-06 — End: 2024-04-06
  Administered 2024-04-06: 50 ug via ORAL
  Filled 2024-04-06: qty 1

## 2024-04-06 MED ORDER — POVIDONE-IODINE 10 % EX SWAB: 2.0000 | Freq: Once | CUTANEOUS | Status: DC

## 2024-04-06 MED ORDER — ACETAMINOPHEN 10 MG/ML IV SOLN
INTRAVENOUS | Status: DC | PRN
  Administered 2024-04-06: 1000 mg via INTRAVENOUS

## 2024-04-06 MED ORDER — OXYTOCIN-SODIUM CHLORIDE 30-0.9 UT/500ML-% IV SOLN
2.5000 [IU]/h | INTRAVENOUS | Status: AC
  Administered 2024-04-06: 2.5 [IU]/h via INTRAVENOUS
  Filled 2024-04-06: qty 500

## 2024-04-06 SURGICAL SUPPLY — 26 items
BENZOIN TINCTURE PRP APPL 2/3 (GAUZE/BANDAGES/DRESSINGS) ×2 IMPLANT
CHLORAPREP W/TINT 26 (MISCELLANEOUS) ×4 IMPLANT
CLAMP UMBILICAL CORD (MISCELLANEOUS) ×2 IMPLANT
CLOTH BEACON ORANGE TIMEOUT ST (SAFETY) ×2 IMPLANT
DRSG OPSITE POSTOP 4X10 (GAUZE/BANDAGES/DRESSINGS) ×2 IMPLANT
ELECTRODE REM PT RTRN 9FT ADLT (ELECTROSURGICAL) ×2 IMPLANT
EXTRACTOR VACUUM M CUP 4 TUBE (SUCTIONS) IMPLANT
GLOVE BIOGEL PI IND STRL 7.0 (GLOVE) ×4 IMPLANT
GLOVE BIOGEL PI IND STRL 7.5 (GLOVE) ×4 IMPLANT
GLOVE ECLIPSE 7.5 STRL STRAW (GLOVE) ×2 IMPLANT
GOWN STRL REUS W/TWL LRG LVL3 (GOWN DISPOSABLE) ×6 IMPLANT
KIT ABG SYR 3ML LUER SLIP (SYRINGE) IMPLANT
MAT PREVALON FULL STRYKER (MISCELLANEOUS) IMPLANT
NDL HYPO 25X5/8 SAFETYGLIDE (NEEDLE) IMPLANT
NEEDLE HYPO 25X5/8 SAFETYGLIDE (NEEDLE) IMPLANT
NS IRRIG 1000ML POUR BTL (IV SOLUTION) ×2 IMPLANT
PACK C SECTION WH (CUSTOM PROCEDURE TRAY) ×2 IMPLANT
PAD OB MATERNITY 4.3X12.25 (PERSONAL CARE ITEMS) ×2 IMPLANT
RTRCTR C-SECT PINK 25CM LRG (MISCELLANEOUS) ×2 IMPLANT
STRIP CLOSURE SKIN 1/2X4 (GAUZE/BANDAGES/DRESSINGS) ×2 IMPLANT
SUT VIC AB 0 CT1 36 (SUTURE) ×6 IMPLANT
SUT VIC AB 2-0 CT1 TAPERPNT 27 (SUTURE) ×2 IMPLANT
SUT VIC AB 4-0 KS 27 (SUTURE) ×2 IMPLANT
TOWEL OR 17X24 6PK STRL BLUE (TOWEL DISPOSABLE) ×2 IMPLANT
TRAY FOLEY W/BAG SLVR 14FR LF (SET/KITS/TRAYS/PACK) ×2 IMPLANT
WATER STERILE IRR 1000ML POUR (IV SOLUTION) ×2 IMPLANT

## 2024-04-06 NOTE — Op Note (Signed)
 Cesarean Section Operative Report  Tammy Welch  04/06/2024  Indications: fetal indications, undesired fertility  Pre-operative Diagnosis: primary low transverse cesarean section, bilateral salpingectomy  Post-operative Diagnosis: Same   Surgeon: Surgeons and Role:    * Tammy Welch, Haynes Lips, MD - Primary    * Tammy Spires, MD - Fellow   Attending Attestation: I was present and scrubbed for the entire procedure.   An experienced assistant was required given the standard of surgical care given the complexity of the case.  This assistant was needed for exposure, dissection, suctioning, retraction, instrument exchange, assisting with delivery with administration of fundal pressure, and for overall help during the procedure.  Anesthesia: epidural   Quantified Blood Loss: 834 ml  Total IV Fluids: 2000 ml LR plus 500 cc albumin  Urine Output:: 100 ml dark yellow urine  Specimens: tubes to pathology  Findings: Viable female infant in cephalic presentation; Apgars pending; weight pending; arterial cord pH not obtained;  clear amniotic fluid; intact placenta with three vessel cord; normal uterus, fallopian tubes and ovaries bilaterally.  Baby condition / location:  Couplet care / Skin to Skin   Complications: no complications  Indications: Tammy Welch is a 33 y.o. [id number] with an IUP [redacted]w[redacted]d presenting for ECV which was successful, proceeded then to induction of labor but that failed secondary to fetal intolerance remote from delivery.  The risks, benefits, complications, treatment options, and exected outcomes were discussed with the patient . The patient dwith the proposed plan, giving informed consent. identified as Tammy Welch and the procedure verified as C-Section Delivery.  Procedure Details:  The patient was taken back to the operative suite where epidural anesthesia was dosed.  A time out was held and the above information confirmed.   After induction of  anesthesia, the patient was draped and prepped in the usual sterile manner and placed in a dorsal supine position with a leftward tilt. A Pfannenstiel incision was made and carried down through the subcutaneous tissue to the fascia. Fascial incision was made and sharply extended transversely. The fascia was separated from the underlying rectus tissue superiorly and inferiorly. The peritoneum was identified and bluntly entered and extended longitudinally. Alexis retractor was placed. A bladder flap was not created. A low transverse uterine incision was made and extended bluntly. Delivered from cephalic presentation was a viable infant with Apgars and weight as above.  After waiting 60 seconds for delayed cord cutting, the umbilical cord was clamped and cut cord blood was obtained for evaluation. Cord ph was not sent. The placenta was removed Intact and appeared normal. The uterine outline, tubes and ovaries appeared normal. The uterine incision was closed with running unlocked sutures 0-Vicryl in one layer.   Hemostasis was observed. The peritoneum was closed with 2-0 vicryl. The rectus muscles were examined and hemostasis observed. The fascia was then reapproximated with running sutures of 0-Vicryl. The subcuticular closure was not performed. The skin was closed with 4-0 Vicryl.  Instrument, sponge, and needle counts were correct prior the abdominal closure and were correct at the conclusion of the case.     Disposition: PACU - hemodynamically stable.   Maternal Condition: stable       Signed: Raymonde Calico, MD 04/06/2024 5:16 PM

## 2024-04-06 NOTE — Anesthesia Procedure Notes (Signed)
 Epidural Patient location during procedure: OB Start time: 04/06/2024 9:25 AM End time: 04/06/2024 9:35 AM  Staffing Anesthesiologist: Jacquelyne Matte, DO Performed: anesthesiologist   Preanesthetic Checklist Completed: patient identified, IV checked, risks and benefits discussed, monitors and equipment checked, pre-op evaluation and timeout performed  Epidural Patient position: sitting Prep: DuraPrep and site prepped and draped Patient monitoring: continuous pulse ox, blood pressure, heart rate and cardiac monitor Approach: midline Location: L3-L4 Injection technique: LOR air  Needle:  Needle type: Tuohy  Needle gauge: 17 G Needle length: 9 cm Needle insertion depth: 6 cm Catheter type: closed end flexible Catheter size: 19 Gauge Catheter at skin depth: 11 cm Test dose: negative  Assessment Sensory level: T8 Events: blood not aspirated, no cerebrospinal fluid, injection not painful, no injection resistance, no paresthesia and negative IV test  Additional Notes Patient identified. Risks/Benefits/Options discussed with patient including but not limited to bleeding, infection, nerve damage, paralysis, failed block, incomplete pain control, headache, blood pressure changes, nausea, vomiting, reactions to medication both or allergic, itching and postpartum back pain. Confirmed with bedside nurse the patient's most recent platelet count. Confirmed with patient that they are not currently taking any anticoagulation, have any bleeding history or any family history of bleeding disorders. Patient expressed understanding and wished to proceed. All questions were answered. Sterile technique was used throughout the entire procedure. Please see nursing notes for vital signs. Test dose was given through epidural catheter and negative prior to continuing to dose epidural or start infusion. Warning signs of high block given to the patient including shortness of breath, tingling/numbness in  hands, complete motor block, or any concerning symptoms with instructions to call for help. Patient was given instructions on fall risk and not to get out of bed. All questions and concerns addressed with instructions to call with any issues or inadequate analgesia.    CSE performed for version with 24G pencan through tuohy, clear CSF, no issues.Reason for block:procedure for pain

## 2024-04-06 NOTE — Progress Notes (Signed)
 33 yo g2p1001 @ 39+1 here for ECV which was successful, now inducing. Received oral/vaginal cytotec at noon, recurrent lates after that that did not resolve with fluids and repositioning so terbutaline  given, lates resolved though remained tachy though with moderate variability, however contractions have returned and so have lates, these are mild contractions that the patient is not feeling, cervix remains 1.5 cm dilated. She has now received a second dose of terbutaline  for these late decels and they have resolved once again She is remote from delivery and fetus is not tolerating very mild contractions, I shared that at this point risks to fetus outweigh risk to mother with expectant mgmt and as we are remote from delivery I advise proceeding with cesarean delivery, and she is in agreement.  The risks of cesarean section were discussed with the patient including but were not limited to: bleeding which may require transfusion or reoperation; infection which may require antibiotics; injury to bowel, bladder, ureters or other surrounding organs; injury to the fetus; need for additional procedures including hysterectomy in the event of a life-threatening hemorrhage; placental abnormalities wth subsequent pregnancies, incisional problems, thromboembolic phenomenon and other postoperative/anesthesia complications.  Patient also desires permanent sterilization.  Other reversible forms of contraception were discussed with patient; she declines all other modalities. Risks of procedure discussed with patient including but not limited to: risk of regret, permanence of method, bleeding, infection, injury to surrounding organs and need for additional procedures.  Failure risk of 1-2% with increased risk of ectopic gestation if pregnancy occurs was also discussed with patient.  The patient concurred with the proposed plan, giving informed written consent for the procedures. She elects salpingectomy for possible reduced cancer  risk and is aware this is irreversible. Anesthesia and OR aware.  Preoperative prophylactic antibiotics and SCDs ordered on call to the OR.  To OR when ready.

## 2024-04-06 NOTE — H&P (Signed)
 Faculty Practice H&P  Tammy Welch is a 33 y.o. female G2P1001 with IUP at [redacted]w[redacted]d presenting for external cephalic version and if unsuccessful primary cesarean section and bilateral tubal salpingectomy for breech presentation and request for sterilization. Pregnancy was been complicated by breech presentation in the third trimester.    Pt states she has been having no contractions,  vaginal bleeding, intact membranes, with normal fetal movement.     Prenatal Course Source of Care: [facility] with onset of care at 10 weeks  Pregnancy complications or risks: Patient Active Problem List   Diagnosis Date Noted   Malpresentation before onset of labor 03/23/2024   Request for sterilization 02/02/2024   Hx of postpartum depression, currently pregnant 11/25/2023   Urinary tract infection in mother during first trimester of pregnancy 09/22/2023   Supervision of other normal pregnancy, antepartum 09/17/2023   Anxiety 10/04/2019   Depressive disorder 10/04/2019   She desires bilateral tubal ligation for contraception.  She plans to breastfeed, plans to bottle feed  Prenatal labs and studies: ABO, Rh: --/--/AB POS (06/16 1137) Antibody: NEG (06/16 1137) Rubella: 4.14 (11/27 1557) RPR: NON REACTIVE (06/16 1134)  HBsAg: Negative (11/27 1557)  HIV: Non Reactive (03/25 0830)  GBS: Negative/-- (05/27 1539)  2hr Glucola: negative Genetic screening: normal Anatomy US : normal  Past Medical History:  Past Medical History:  Diagnosis Date   Anxiety    no meds   Boil of buttock    recurrent   Chlamydia 03/31/2015   Treated, TOC 04/20/15   Depression    History of marijuana use 03/25/2014    Past Surgical History: History reviewed. No pertinent surgical history.  Obstetrical History:  OB History     Gravida  2   Para  1   Term  1   Preterm      AB      Living  1      SAB      IAB      Ectopic      Multiple  0   Live Births  1           Gynecological  History:  OB History     Gravida  2   Para  1   Term  1   Preterm      AB      Living  1      SAB      IAB      Ectopic      Multiple  0   Live Births  1           Social History:  Social History   Socioeconomic History   Marital status: Single    Spouse name: Not on file   Number of children: Not on file   Years of education: Not on file   Highest education level: Not on file  Occupational History   Not on file  Tobacco Use   Smoking status: Never   Smokeless tobacco: Never  Vaping Use   Vaping status: Never Used  Substance and Sexual Activity   Alcohol use: Not Currently    Comment: liquor and beer last week   Drug use: Not Currently    Frequency: 2.0 times per week    Types: Marijuana   Sexual activity: Yes    Birth control/protection: None  Other Topics Concern   Not on file  Social History Narrative   Not on file   Social Drivers of Health   Financial  Resource Strain: Low Risk  (09/17/2023)   Overall Financial Resource Strain (CARDIA)    Difficulty of Paying Living Expenses: Not hard at all  Food Insecurity: No Food Insecurity (04/06/2024)   Hunger Vital Sign    Worried About Running Out of Food in the Last Year: Never true    Ran Out of Food in the Last Year: Never true  Transportation Needs: No Transportation Needs (04/06/2024)   PRAPARE - Administrator, Civil Service (Medical): No    Lack of Transportation (Non-Medical): No  Physical Activity: Inactive (09/17/2023)   Exercise Vital Sign    Days of Exercise per Week: 0 days    Minutes of Exercise per Session: 0 min  Stress: No Stress Concern Present (09/17/2023)   [employer] of Occupational Health - Occupational Stress Questionnaire    Feeling of Stress : Only a little  Social Connections: Socially Isolated (09/17/2023)   Social Connection and Isolation Panel    Frequency of Communication with Friends and Family: Once a week    Frequency of Social Gatherings  with Friends and Family: Never    Attends Religious Services: Never    Diplomatic Services operational officer: No    Attends Engineer, structural: Never    Marital Status: Never married    Family History:  Family History  Problem Relation Age of Onset   Hypertension Mother    Hypertension Other     Medications:  Prenatal vitamins,  Current Facility-Administered Medications  Medication Dose Route Frequency Provider Last Rate Last Admin   ceFAZolin (ANCEF) IVPB 2g/100 mL premix  2 g Intravenous On Call to OR Stinson, Jacob J, DO       lactated ringers  infusion   Intravenous Continuous Stinson, Jacob J, DO       povidone-iodine 10 % swab 2 Application  2 Application Topical Once Stinson, Jacob J, DO       tranexamic acid (CYKLOKAPRON) IVPB 1,000 mg  1,000 mg Intravenous Once Stinson, Jacob J, DO        Allergies: Not on File  Review of Systems: - Negative  Physical Exam: Blood pressure 102/80, pulse 96, temperature 98 F (36.7 C), temperature source Oral, resp. rate 20, height 5' 2 (1.575 m), weight 96.2 kg, last menstrual period 07/07/2023, SpO2 98%, not currently breastfeeding. GENERAL: Well-developed, well-nourished female in no acute distress.  LUNGS: Clear to auscultation bilaterally.  HEART: Regular rate and rhythm. ABDOMEN: Soft, nontender, nondistended, gravid. EFW 7 lbs   Pertinent Labs/Studies:   Lab Results  Component Value Date   WBC 6.7 04/05/2024   HGB 12.6 04/05/2024   HCT 37.2 04/05/2024   MCV 87.7 04/05/2024   PLT 129 (L) 04/05/2024    Assessment : Tammy Welch is a 33 y.o. G2P1001 at [redacted]w[redacted]d being admitted for external cephalic version.  If successful will be admitted for induction of labor for unstable lie.  If unsuccessful patient will go for primary cesarean section and bilateral salpingectomy secondary to breech presentation and unwanted fertility  Plan:  Discussed risks and benefits of procedure. Discussed intent of procedure is to  turn baby to vertex to avoid cesarean, but if unsuccessful will need primary cesarean around 39 weeks. Discussed success rate is generally around 50%. Reviewed approximately 1% risk of fetal intolerance requiring emergency cesarean section, due to placental abruption, cord accident, rupture of membranes +/- cord prolapse, or other events leading to fetal distress.   The risks of cesarean section were discussed with  the patient including but were not limited to: bleeding which may require transfusion or reoperation; infection which may require antibiotics; injury to bowel, bladder, ureters or other surrounding organs; injury to the fetus; need for additional procedures including hysterectomy in the event of a life-threatening hemorrhage; placental abnormalities wth subsequent pregnancies, incisional problems, thromboembolic phenomenon and other postoperative/anesthesia complications.  Patient also desires permanent sterilization.  Other reversible forms of contraception were discussed with patient; she declines all other modalities. Risks of procedure discussed with patient including but not limited to: risk of regret, permanence of method, bleeding, infection, injury to surrounding organs and need for additional procedures.  Failure risk of about 1% with increased risk of ectopic gestation if pregnancy occurs was also discussed with patient.  Also discussed possibility of post-tubal pain syndrome. The patient concurred with the proposed plan, giving informed written consent for the procedures.  Patient has been NPO since last night she will remain NPO for procedure. Anesthesia and OR aware.  Preoperative prophylactic antibiotics and SCDs ordered on call to the OR.    Verbal and written consent was obtained.  Blood type is Rh positive, rhogam was not indicated. Patient has been NPO since last night she will remain NPO for procedure. Anesthesia and OR aware.   Will give terbutaline  0.25 mg SQ and wait until  heart rate is >100 prior to starting procedure.      Kaysin Brock V, MD 04/06/2024, 8:57 AM

## 2024-04-06 NOTE — Progress Notes (Signed)
 33 yo g2p1001 @ 39+1 here s/p successful ECV for breech, now inducing. Received vaginal/oral cytotec at noon and shortly after began experiencing repetitive lates. These have not resolved with position change and IV fluids. No bleeding. Terbutaline  given, if lates resolve will plan on resuming induction with Foley attempt and/or pitocin . If repetitive lates persist will advise cesarean delivery, as we are remote from delivery.

## 2024-04-06 NOTE — Discharge Summary (Signed)
 Postpartum Discharge Summary  Date of Service updated     Patient Name: Tammy Welch DOB: 1991/09/30 MRN: [id number]  Date of admission: 04/06/2024 Delivery date:04/06/2024 Delivering provider: KANDIS ASA BEDFORD Date of discharge: 04/09/2024  Admitting diagnosis: Breech presentation [O32.1XX0] Intrauterine pregnancy: [redacted]w[redacted]d     Secondary diagnosis:  Principal Problem:   S/P cesarean section Active Problems:   Anxiety   Depressive disorder   Supervision of other normal pregnancy, antepartum   Hx of postpartum depression, currently pregnant   Request for sterilization   Breech presentation  Additional problems: None    Discharge diagnosis: Term Pregnancy Delivered                                              Post partum procedures:postpartum tubal ligation Augmentation: Cytotec  Complications: None  Hospital course: Induction of Labor With Cesarean Section   33 y.o. yo G2P2002 at [redacted]w[redacted]d was admitted to the hospital 04/06/2024 for ECV. ECV successful, patient was brought to labor and delivery for induction of labor. Was given Cytotec  to begin induction of labor. Fetal heart rate with tachycardia and decelerations, requiring multiple doses of Terbutaline . At this point, recommendation made for pLTCS for fetal intolerance to labor. Delivery details are as follows: Membrane Rupture Time/Date: 4:35 PM,04/06/2024  Delivery Method:C-Section, Low Transverse Operative Delivery:N/A Details of operation can be found in separate operative Note.  Patient had a postpartum course complicated by none. She is ambulating, tolerating a regular diet, passing flatus, and urinating well.  Patient is discharged home in stable condition on 04/09/24.      Newborn Data: Birth date:04/06/2024 Birth time:4:35 PM Gender:Female Living status:Living Apgars:8 ,9  Weight:3510 g                               Magnesium Sulfate received: No BMZ received: No Rhophylac:N/A MMR:N/A T-DaP:Given  prenatally Flu: No RSV Vaccine received: No Transfusion:No  Immunizations received: There is no immunization history for the selected administration types on file for this patient.  Physical exam  Vitals:   04/07/24 2109 04/08/24 0536 04/08/24 2121 04/09/24 0512  BP: (!) 94/56 107/65 115/71 (!) 102/59  Pulse: 75  76 62  Resp: 18 18 19 18   Temp: 98.5 F (36.9 C) 98.3 F (36.8 C) 98.1 F (36.7 C) 98.2 F (36.8 C)  TempSrc: Oral Oral Oral Oral  SpO2: 100% 98% 99% 100%  Weight:      Height:       General: alert, cooperative, and no distress Lochia: appropriate Uterine Fundus: firm Incision: Dressing is clean, dry, and intact DVT Evaluation: No evidence of DVT seen on physical exam. Labs: Lab Results  Component Value Date   WBC 14.2 (H) 04/07/2024   HGB 10.3 (L) 04/07/2024   HCT 31.1 (L) 04/07/2024   MCV 88.6 04/07/2024   PLT 117 (L) 04/07/2024      Latest Ref Rng & Units 07/24/2021    7:56 AM  CMP  Glucose 70 - 99 mg/dL 92   BUN 6 - 20 mg/dL 11   Creatinine 9.55 - 1.00 mg/dL 9.14   Sodium 864 - 854 mmol/L 139   Potassium 3.5 - 5.1 mmol/L 3.8   Chloride 98 - 111 mmol/L 105   CO2 22 - 32 mmol/L 28   Calcium 8.9 - 10.3  mg/dL 9.1    Edinburgh Score:    04/08/2024    9:22 PM  Edinburgh Postnatal Depression Scale Screening Tool  I have been able to laugh and see the funny side of things. --   No data recorded  After visit meds:  Allergies as of 04/09/2024   No Known Allergies      Medication List     TAKE these medications    FLUoxetine  10 MG capsule Commonly known as: PROZAC  Take 1 capsule (10 mg total) by mouth daily.   hydrOXYzine  25 MG tablet Commonly known as: ATARAX  Take 1 tablet (25 mg total) by mouth 3 (three) times daily as needed for anxiety.   ibuprofen  800 MG tablet Commonly known as: ADVIL  Take 1 tablet (800 mg total) by mouth every 8 (eight) hours as needed for mild pain (pain score 1-3) or moderate pain (pain score 4-6).    multivitamin-prenatal 27-0.8 MG Tabs tablet Take 1 tablet by mouth daily.   oxyCODONE  5 MG immediate release tablet Commonly known as: Roxicodone  Take 1 tablet (5 mg total) by mouth every 6 (six) hours as needed for severe pain (pain score 7-10).   senna 8.6 MG Tabs tablet Commonly known as: SENOKOT Take 1 tablet (8.6 mg total) by mouth at bedtime as needed for mild constipation.         Discharge home in stable condition Infant Feeding: Both Infant Disposition:home with mother Discharge instruction: per After Visit Summary and Postpartum booklet. Activity: Advance as tolerated. Pelvic rest for 6 weeks.  Diet: routine diet Future Appointments: Future Appointments  Date Time Provider Department Center  04/13/2024  2:20 PM [facility] NURSE [facility] None  05/13/2024  3:10 PM Anyanwu, Gloris LABOR, MD [facility] None   Follow up Visit: Message sent to [facility] 6/17  Please schedule this patient for a In person postpartum visit in 6 weeks with the following provider: Any provider. Additional Postpartum F/U:Postpartum Depression checkup and Incision check 1 week  Low risk pregnancy complicated by: fetal malpresentation Delivery mode:  C-Section, Low Transverse Anticipated Birth Control:  BTL done [facility]   04/09/2024 Almarie CHRISTELLA Moats, MD

## 2024-04-06 NOTE — Anesthesia Preprocedure Evaluation (Signed)
 Anesthesia Evaluation  Patient identified by MRN, date of birth, ID band Patient awake    Reviewed: Allergy & Precautions, Patient's Chart, lab work & pertinent test results  Airway Mallampati: II  TM Distance: >3 FB Neck ROM: Full    Dental no notable dental hx.    Pulmonary neg pulmonary ROS   Pulmonary exam normal breath sounds clear to auscultation       Cardiovascular negative cardio ROS Normal cardiovascular exam Rhythm:Regular Rate:Normal     Neuro/Psych  PSYCHIATRIC DISORDERS Anxiety Depression    negative neurological ROS     GI/Hepatic negative GI ROS, Neg liver ROS,,,  Endo/Other  Obesity BMI 39  Renal/GU negative Renal ROS  negative genitourinary   Musculoskeletal negative musculoskeletal ROS (+)    Abdominal  (+) + obese  Peds negative pediatric ROS (+)  Hematology  (+) Blood dyscrasia Hb 12.6, plt 129   Anesthesia Other Findings   Reproductive/Obstetrics (+) Pregnancy                             Anesthesia Physical Anesthesia Plan  ASA: 2  Anesthesia Plan: Combined Spinal and Epidural   Post-op Pain Management:    Induction:   PONV Risk Score and Plan: 2  Airway Management Planned: Natural Airway  Additional Equipment: None  Intra-op Plan:   Post-operative Plan:   Informed Consent: I have reviewed the patients History and Physical, chart, labs and discussed the procedure including the risks, benefits and alternatives for the proposed anesthesia with the patient or authorized representative who has indicated his/her understanding and acceptance.       Plan Discussed with:   Anesthesia Plan Comments: (CSE for version- to labor deck vs OR depending on result of version)       Anesthesia Quick Evaluation

## 2024-04-06 NOTE — Lactation Note (Signed)
 This note was copied from a baby's chart. Lactation Consultation Note  Patient Name: Tammy Welch ZOXWR'U Date: 04/06/2024 Age:33 hours Reason for consult: Initial assessment;Mother's request;Term [name] latched infant on her right breast with pillow support, infant breastfeed briefly for 6 minutes and aftewards was supplemented with 10 mls of formula. [name] will continue to latch infant first every feeding to help stimulate and establish her milk supply. [name] knows to call for further latch assistance if needed. [name] will continue to breastfeed infant by cues, on demand, 8+ times within 24 hours, skin to skin. LC discussed infant's input and output, infant has 3 stools and 2 voids since birth. LC discussed the importance of maternal rest, meals and snacks. [name] was made aware of O/P services, breastfeeding support groups, community resources, and our phone # for post-discharge questions.    Maternal Data Has patient been taught Hand Expression?: Yes Does the patient have breastfeeding experience prior to this delivery?: Yes How long did the patient breastfeed?: Briefly breastfeed her 1st child for 2 months  Feeding Mother's Current Feeding Choice: Breast Milk and Formula Nipple Type: Slow - flow  LATCH Score Latch: Grasps breast easily, tongue down, lips flanged, rhythmical sucking.  Audible Swallowing: A few with stimulation  Type of Nipple: Everted at rest and after stimulation  Comfort (Breast/Nipple): Soft / non-tender  Hold (Positioning): Assistance needed to correctly position infant at breast and maintain latch.  LATCH Score: 8   Lactation Tools Discussed/Used    Interventions Interventions: Skin to skin;Assisted with latch;Breast compression;Adjust position;Support pillows;Position options;Expressed milk;Education;Pace feeding;Guidelines for Milk Supply and Pumping Schedule Handout;LC Services brochure;CDC milk storage guidelines;CDC Guidelines for Breast Pump  Cleaning  Discharge Pump: DEBP;Personal  Consult Status Consult Status: Follow-up Date: 04/07/24 Follow-up type: In-patient    Pecolia Bourbon 04/06/2024, 9:15 PM

## 2024-04-06 NOTE — Transfer of Care (Signed)
 Immediate Anesthesia Transfer of Care Note  Patient: Tammy Welch  Procedure(s) Performed: CESAREAN SECTION, WITH BILATERAL TUBAL LIGATION  Patient Location: PACU  Anesthesia Type:Epidural  Level of Consciousness: awake, alert , and oriented  Airway & Oxygen Therapy: Patient Spontanous Breathing  Post-op Assessment: Report given to RN and Post -op Vital signs reviewed and stable  Post vital signs: Reviewed and stable  Last Vitals:  Vitals Value Taken Time  BP 94/44 04/06/24 17:30  Temp    Pulse 120 04/06/24 17:38  Resp 19 04/06/24 17:38  SpO2 100 % 04/06/24 17:38  Vitals shown include unfiled device data.  Last Pain:  Vitals:   04/06/24 1214  TempSrc: Oral  PainSc:          Complications: No notable events documented.

## 2024-04-06 NOTE — OR Nursing (Signed)
 Dr. Cresenzo and Finucaine at bedside monitoring fhr and BP following version

## 2024-04-06 NOTE — Procedures (Signed)
 ECV Procedure Note Patient was given a epidural prior to attempting the procedure. NST was reactive prior to the procedure.  Patient was given 0.25 mg of SQ terbutaline  prior to initiating the procedure. ECV was attempted 1 times. Procedure was successful. Patient was monitored in the PACU and transferred to labor and delivery for induction of labor for unstable lie.

## 2024-04-06 NOTE — Progress Notes (Signed)
 33 yo g2p1001 @ 39+1 here s/p successful ECV for breech, now inducing. Has resolved poly and EFW of 14%. Cat 2 tracing for tachycardia after version, no bleeding, suspect this is 2/2 terbutaline , hr is improving and there is good variability with accels. Fingertip/thick/high with a very anterior and anteverted uterus, not amenable to Foley placement at this time, will give vaginal/buccal cytotec, consider Foley placement in about 4 hours at next check.

## 2024-04-07 LAB — CBC
HCT: 31.1 % — ABNORMAL LOW (ref 36.0–46.0)
Hemoglobin: 10.3 g/dL — ABNORMAL LOW (ref 12.0–15.0)
MCH: 29.3 pg (ref 26.0–34.0)
MCHC: 33.1 g/dL (ref 30.0–36.0)
MCV: 88.6 fL (ref 80.0–100.0)
Platelets: 117 10*3/uL — ABNORMAL LOW (ref 150–400)
RBC: 3.51 MIL/uL — ABNORMAL LOW (ref 3.87–5.11)
RDW: 15.8 % — ABNORMAL HIGH (ref 11.5–15.5)
WBC: 14.2 10*3/uL — ABNORMAL HIGH (ref 4.0–10.5)
nRBC: 0 % (ref 0.0–0.2)

## 2024-04-07 NOTE — Lactation Note (Signed)
 This note was copied from a baby's chart. Lactation Consultation Note  Patient Name: Tammy Welch ZOXWR'U Date: 04/07/2024 Age:33 hours   P2- Infant was sent to NICU around noon, so the RN set up [name] with the hospital DEBP. The RN did not have access to infant's chart to charge for the DEBP, so LC charged it for her under infant's chart.  Vernette Goo BS, IBCLC 04/07/2024, 4:16 PM

## 2024-04-07 NOTE — Anesthesia Postprocedure Evaluation (Signed)
 Anesthesia Post Note  Patient: Tammy Welch  Procedure(s) Performed: CESAREAN SECTION, WITH BILATERAL TUBAL LIGATION     Patient location during evaluation: Mother Baby Anesthesia Type: Epidural Level of consciousness: oriented and awake and alert Pain management: pain level controlled Vital Signs Assessment: post-procedure vital signs reviewed and stable Respiratory status: spontaneous breathing and respiratory function stable Cardiovascular status: blood pressure returned to baseline and stable Postop Assessment: no headache, no backache, no apparent nausea or vomiting and able to ambulate Anesthetic complications: no   No notable events documented.  Last Vitals:  Vitals:   04/07/24 0841 04/07/24 1558  BP: (!) 109/54 (!) 97/52  Pulse: 65 87  Resp: 20 20  Temp: 36.8 C 36.7 C  SpO2: 98% 100%    Last Pain:  Vitals:   04/07/24 1936  TempSrc:   PainSc: 8                  Leslye Rast

## 2024-04-07 NOTE — Progress Notes (Signed)
 POSTPARTUM PROGRESS NOTE  Subjective: Tammy Welch is a 33 y.o. [id number] s/p pLTCS at [redacted]w[redacted]d.  She reports she is doing well. No acute events overnight. She denies any problems with ambulating, voiding or po intake. Denies nausea or vomiting. She has passed flatus. Pain is moderately controlled.  Lochia is decreasing.  Objective: Blood pressure 123/60, pulse 83, temperature 97.9 F (36.6 C), temperature source Oral, resp. rate 18, height 5' 2 (1.575 m), weight 96.2 kg, last menstrual period 07/07/2023, SpO2 97%, unknown if currently breastfeeding.  Physical Exam:  General: alert, cooperative and no distress Chest: no respiratory distress Abdomen: soft, non-tender  Uterine Fundus: firm and at level of umbilicus Extremities: No calf swelling or tenderness  trace edema  Recent Labs    04/05/24 1134 04/07/24 0506  HGB 12.6 10.3*  HCT 37.2 31.1*    Assessment/Plan: Jeana A Elling is a 33 y.o. [id number] s/p pLTCS at [redacted]w[redacted]d for NRFHT.  Routine Postpartum Care: Doing well, pain well-controlled.  -- Continue routine care, lactation support  -- Contraception: BTS -- Feeding: formula  Dispo: Plan for discharge 6/19-6/20.  Maud Sorenson, MD OB Fellow 04/07/2024 7:15 AM

## 2024-04-08 LAB — SURGICAL PATHOLOGY

## 2024-04-08 MED ORDER — FLUOXETINE HCL 10 MG PO CAPS
10.0000 mg | ORAL_CAPSULE | Freq: Every day | ORAL | Status: DC
  Administered 2024-04-08 – 2024-04-09 (×2): 10 mg via ORAL
  Filled 2024-04-08 (×2): qty 1

## 2024-04-08 MED ORDER — HYDROXYZINE HCL 25 MG PO TABS: 25.0000 mg | ORAL_TABLET | Freq: Three times a day (TID) | ORAL | Status: DC | PRN

## 2024-04-08 MED ORDER — HYDROCODONE-ACETAMINOPHEN 5-325 MG PO TABS
1.0000 | ORAL_TABLET | Freq: Four times a day (QID) | ORAL | Status: DC | PRN
  Administered 2024-04-08 – 2024-04-09 (×2): 1 via ORAL
  Filled 2024-04-08 (×2): qty 1

## 2024-04-08 NOTE — Clinical Social Work Maternal (Addendum)
 CLINICAL SOCIAL WORK MATERNAL/CHILD NOTE  Patient Details  Name: Tammy Welch MRN: [id number] Date of Birth: 1991-07-09  Date:  04/08/2024  Clinical Social Worker Initiating Note:  Nickolas Barr, [place] Date/Time: Initiated:  04/08/24/1145     Child's Name:  Tammy Welch   Biological Parents:  Mother (Mother: Tammy Welch November 30[birth year] 1990)   Need for Interpreter:  None   Reason for Referral:  Other (Comment), Behavioral Health Concerns (NICU admission)   Address:  [address] [place] [address]    Phone number:  [phone] (home)     Additional phone number:   Household Members/Support Persons (HM/SP):   Household Member/Support Person 1   HM/SP Name Relationship DOB or Age  HM/SP -1 Tammy Welch Daughter 04/2015  HM/SP -2        HM/SP -3        HM/SP -4        HM/SP -5        HM/SP -6        HM/SP -7        HM/SP -8          Natural Supports (not living in the home):  Extended Family, Immediate Family   Professional Supports: None   Employment: Full-time   Type of Work: In [employer] Care   Education:  Attending college   Homebound arranged:    Financial Resources:  Medicaid   Other Resources:  Sales executive  , [facility]   Cultural/Religious Considerations Which May Impact Care:    Strengths:  Ability to meet basic needs  , Home prepared for child  , Pediatrician chosen   Psychotropic Medications:         Pediatrician:    Armed forces operational officer area  Pediatrician List:   [facility]  [place]    [place]    [place]      Pediatrician Fax Number:    Risk Factors/Current Problems:  Mental Health Concerns     Cognitive State:  Able to Concentrate  , Alert     Mood/Affect:  Calm  , Comfortable  , Interested     CSW Assessment: CSW received consult for NICU admission. CSW met with [name] to offer support and complete assessment.     CSW met with [name] at bedside and introduced CSW role. [name] was seated on the bed and her cousin, Tammy Welch, was present offering her support. [name] appeared pleasant, calm and welcomed CSW visit. CSW offered [name] privacy, but [name] gave CSW permission to share all information with her cousin present. [name] confirmed that her demographic information on hospital file was correct. CSW inquired about FOB's involvement. [name] reported that FOB was Tammy Welch (04/01/1990) but she was uncertain about his involvement. However, she shared that her cousins Tammy Welch, and Tammy Welch would be helping to care for the baby.  CSW acknowledged [name]'s support system and encouraged to continue relying on her supports as needed.  CSW inquired about [name]'s housing situation. [name] reported that she lives with her eight-year-old daughter. [name] reported she is currently enrolled at [employer], studying for [employer] and employed at [employer]. She stated that she receives food stamps but prefers not to apply to [facility] due to [facility] mandatory in-person appointment requirement.  CSW assessed [name]'s feelings surrounding her infant's NICU admission. [name] expressed, it's been a lot. CSW acknowledged [name]'s feelings  and offered emotional support.  [name] shared that she had been receiving a lot of information regarding her infant's care, which has increased her anxiety symptoms. She reported a history of anxiety and depression following the birth of her daughter and had also experienced panic attacks. [name] reported that she had treated symptoms with Fluoxetine and Vistaril in the past but not during her pregnancy. She reported that she managed without medication by implementing coping strategies such as deep breathing and listening to music. CSW encouraged [name] to continue using her coping strategies. [name] reported that she was considering restarting her medications but reported she was not certain if her former  psychiatrist at [facility] would accept her current [employer].  CSW offered to follow up with [name]'s nurse to determine if her OB provider could restart her medication prior to discharge. [name] was receptive to this. CSW provided education regarding the baby blues period vs. perinatal mood disorders, discussed treatment and gave resources for mental health. CSW recommended [name] complete a self-evaluation during the postpartum time period using the New Mom Checklist from Postpartum Progress and encouraged [name] to contact a medical professional if symptoms are noted at any time. CSW assessed [name] for safety. [name] denied having thoughts of harming herself and others. [name] denied domestic violence concerns.  CSW asked [name] if she had essential items to care her infant. [name] reported having all essential items including a car seat and crib. CSW provided review of Sudden Infant Death Syndrome (SIDS) precautions. [name] verbalized understanding. CSW asked if [name] had selected a pediatrician. [name] reported [facility] [place]. CSW noticed pamphlet for [employer] and discussed services. [name] reported that she would review and follow up if desired.  CSW informed [name]'s nurse that [name] reported having anxiety and had asked to restart medications (Fluoxetine & Vistaril). Nurse reported that she would contact [name]'s provider.  CSW provided review of the NICU support services and offered to check in with [name] weekly while the infant remains in the NICU. [name] opted to call CSW if needs arise. CSW assessed [name] for additional needs. [name] reported none.   CSW Plan/Description:  Perinatal Mood and Anxiety Disorder (PMADs) Education, Sudden Infant Death Syndrome (SIDS) Education, No Further Intervention Required/No Barriers to Discharge, Other Information/Referral to [employer], LCSW 04/08/2024, 1:12 PM

## 2024-04-08 NOTE — Lactation Note (Signed)
 This note was copied from a baby's chart.  NICU Lactation Consultation Note  Patient Name: Boy Ahleah Simko [id number] Date: 04/08/2024 Age:33 hours  Reason for consult: Follow-up assessment; NICU baby; Term  SUBJECTIVE  LC in to visit with P2 Mom of baby Len[place] delivered by C/Section for fetal intolerance.  Baby was transferred to NICU at 18 hrs of life due to need for respiratory support.  Baby was placed on CPAP.  Baby is now on room air and taking 34% PO, gavaging the rest.  Mom states she has put baby on the breast where he latched and fed with NNS for 5 mins.    Mom stated that her pump wasn't working, one side only.  LC offered to assist so she could pump at that time and Mom agreeable.  LC sized Mom with 18 mm flanges and provided a L pumping top to allow hands free pumping. LC assisted with initiation setting and encouraged Mom to increase pump strength as tolerated.  LC reviewed importance of disassembling pump parts, washing, rinsing and air drying in drying bin with clean paper towel.  Mom aware of pump in baby's room and encouraged her to take her parts with her to pump at the bedside and after STS.   OBJECTIVE Infant data: Mother's Current Feeding Choice: Breast Milk and Donor Milk  O2 Device: Room Air FiO2 (%): 21 %  Infant feeding assessment IDFTS - Readiness: 2 IDFTS - Quality: 3 (fatigued quickly, desats at begining of feeding (low 80's))   Maternal data: G2P2002 C-Section, Low Transverse Pumping frequency: inconsistent.  Encouraged to pump every 2-3 hrs Pumped volume: 0 mL Flange Size: 18 Hands-free pumping top sizes: Large Martina Sledge)  Pump: Personal, DEBP (Lansinoh DEBP through insurance)  ASSESSMENT Infant:  Feeding Status: Scheduled 8-11-2-5 Feeding method: Bottle; Tube/Gavage (Bolus) Nipple Type: Nfant Extra Slow Flow (gold)  Maternal: Milk volume: Normal  INTERVENTIONS/PLAN Interventions: Interventions: Breast feeding basics reviewed; Skin to  skin; Breast massage; Hand express; DEBP; Education Tools: Pump; Flanges; Hands-free pumping top Pump Education: Setup, frequency, and cleaning; Milk Storage  Plan: 1- STS with baby 2- Offer the breast with feeding cues 3- Pump both breasts AFTER breastfeeding, goal of pumping 8 times per 24 hrs 4- ask for help prn  Consult Status: NICU follow-up NICU Follow-up type: Verify onset of copious milk; Verify absence of engorgement; Maternal D/C visit   Dario Edison 04/08/2024, 6:15 PM

## 2024-04-09 ENCOUNTER — Other Ambulatory Visit (HOSPITAL_COMMUNITY): Payer: Self-pay

## 2024-04-09 MED ORDER — HYDROXYZINE HCL 25 MG PO TABS
25.0000 mg | ORAL_TABLET | Freq: Three times a day (TID) | ORAL | 0 refills | Status: AC | PRN
  Filled 2024-04-09: qty 30, 10d supply, fill #0

## 2024-04-09 MED ORDER — SENNA 8.6 MG PO TABS
1.0000 | ORAL_TABLET | Freq: Every evening | ORAL | 0 refills | Status: AC | PRN
  Filled 2024-04-09: qty 60, 60d supply, fill #0

## 2024-04-09 MED ORDER — OXYCODONE HCL 5 MG PO TABS
5.0000 mg | ORAL_TABLET | Freq: Four times a day (QID) | ORAL | 0 refills | Status: AC | PRN
  Filled 2024-04-09: qty 20, 5d supply, fill #0

## 2024-04-09 MED ORDER — FLUOXETINE HCL 10 MG PO CAPS
10.0000 mg | ORAL_CAPSULE | Freq: Every day | ORAL | 0 refills | Status: AC
Start: 2024-04-09 — End: ?
  Filled 2024-04-09: qty 60, 60d supply, fill #0

## 2024-04-09 MED ORDER — IBUPROFEN 800 MG PO TABS
800.0000 mg | ORAL_TABLET | Freq: Three times a day (TID) | ORAL | 0 refills | Status: AC | PRN
  Filled 2024-04-09: qty 60, 20d supply, fill #0

## 2024-04-09 NOTE — Social Work (Signed)
 CSW received consult due to score 10 on Edinburgh Depression Screen.    CSW completed a full assessment with Tammy Welch on 04/08/24. Tammy Welch requested to restart medications for anxiety and depression symptoms. Per chart review, Tammy Welch currently has an active prescription for her medications. CSW also provided education regarding Baby Blues vs PMADs and provided Tammy Welch with resources for mental health follow up.  CSW encouraged Tammy Welch to evaluate her mental health throughout the postpartum period with the use of the New Mom Checklist developed by Postpartum Progress as well as the [place] Postnatal Depression Scale and notify a medical professional if symptoms arise.  CSW followed up with Tammy Welch's RN, RN reported no concerns.   Nickolas Barr, MSW, LCSW Clinical Social Worker  [phone] 04/09/2024  9:11 AM

## 2024-04-09 NOTE — Lactation Note (Signed)
 This note was copied from a baby's chart.  NICU Lactation Consultation Note  Patient Name: Boy Swayzee Wadley [id number] Date: 04/09/2024 Age:33 hours  Reason for consult: Follow-up assessment; NICU baby; Term; Maternal discharge  SUBJECTIVE Visited with family of 33 10/25 weeks old AGA NICU female; Ms. Leopard is a P2 but her first child is now 58 y.o. She reported she's pumping but noticed it's still not consistent. However she has taken baby to breast; her plan is to do both, direct breastfeeding along with pumping and bottle feeding, praised her for her efforts. Ms. Toelle is getting discharged today. Reviewed discharge education, pump settings and the importance of consistent pumping for the onset of lactogenesis II and the prevention of engorgement. Offered a loaner pump in case she would like to continue using a hospital grade pump, she has a wearable one at home.  OBJECTIVE Infant data: Mother's Current Feeding Choice: Breast Milk and Donor Milk  O2 Device: Room Air  Infant feeding assessment IDFTS - Readiness: 2 (paci dips, infant did well with no desats) IDFTS - Quality: 5 (desaturation to 79%- PO d/c'd)   Maternal data: G2P2002 C-Section, Low Transverse Pumping frequency: 1 time/24 hours Pumped volume: -- (droplets) Flange Size: 18 Hands-free pumping top sizes: Large Martina Sledge) Risk factor for low/delayed milk supply:: C/S, infant separation  Pump: Hands Free, Personal (Lansinoh DEBP through insurance)  ASSESSMENT Infant: Feeding Status: Scheduled 8-11-2-5 Feeding method: Tube/Gavage (Bolus) Nipple Type: Dr. Leticia Raven Preemie  Maternal: Milk volume: Normal  INTERVENTIONS/PLAN Interventions: Interventions: Breast feeding basics reviewed; DEBP; Education Discharge Education: Engorgement and breast care Tools: Pump; Flanges; Hands-free pumping top Pump Education: Setup, frequency, and cleaning; Milk Storage  Plan: STS around care times Pump both breasts on  initiate mode every 3 hours for 15 minutes, ideally 8 pumping sessions/24 hours Switch to maintain once expressing +20 ml of EBM combined Bring all pump pieces to baby's room after her discharge Continue taking baby to breast on feeding cues around feeding times and call for assistance PRN  No other support person at this time. All questions and concerns answered, family to contact [facility] services PRN.  Consult Status: NICU follow-up NICU Follow-up type: Verify absence of engorgement; Verify onset of copious milk   Tahira Olivarez S Nikiesha Milford 04/09/2024, 12:35 PM

## 2024-04-12 ENCOUNTER — Encounter (HOSPITAL_BASED_OUTPATIENT_CLINIC_OR_DEPARTMENT_OTHER): Payer: Medicaid Other | Admitting: Obstetrics & Gynecology

## 2024-04-13 ENCOUNTER — Ambulatory Visit

## 2024-04-13 NOTE — Progress Notes (Signed)
 Subjective:     Tammy Welch is a 33 y.o. female who presents to the clinic 1 weeks status post low uterine, transverse cesarean section. Pt reports incision is covered. Honeycomb dressing still in place, removed today at this visit.      Objective:    BP 106/66   Pulse 70   LMP 07/07/2023   Breastfeeding Yes  General:  alert, well appearing, in no apparent distress  Incision:   healing well, no drainage, no erythema, no hernia, no seroma, no swelling, no dehiscence, incision well approximated     Assessment:    Doing well postoperatively.   Plan:    1. Continue any current medications. 2. Wound care discussed. 3. Follow up: PP visit.   Duwaine LOISE Galla, RN

## 2024-05-13 ENCOUNTER — Encounter: Payer: Self-pay | Admitting: Obstetrics and Gynecology

## 2024-05-13 ENCOUNTER — Ambulatory Visit: Admitting: Obstetrics and Gynecology

## 2024-05-13 NOTE — Progress Notes (Signed)
 Post Partum Visit Note  Tammy Welch is a 33 y.o. G35P2002 female who presents for a postpartum visit. She is 5 weeks postpartum following a primary cesarean section.  I have fully reviewed the prenatal and intrapartum course. The delivery was at 39 gestational weeks.  Anesthesia: epidural. Postpartum course has been 39.1. Baby is doing well. Baby is feeding by bottle - [place]. Bleeding no bleeding. Bowel function is normal. Bladder function is normal. Patient is not sexually active. Contraception method is tubal ligation.   Postpartum depression screening: negative, score 0.   The pregnancy intention screening data noted above was reviewed. Potential methods of contraception were discussed. The patient elected to proceed with No data recorded.   Edinburgh Postnatal Depression Scale - 05/13/24 1430       Edinburgh Postnatal Depression Scale:  In the Past 7 Days   I have been able to laugh and see the funny side of things. 0    I have looked forward with enjoyment to things. 0    I have blamed myself unnecessarily when things went wrong. 0    I have been anxious or worried for no good reason. 0    I have felt scared or panicky for no good reason. 0    Things have been getting on top of me. 0    I have been so unhappy that I have had difficulty sleeping. 0    I have felt sad or miserable. 0    I have been so unhappy that I have been crying. 0    The thought of harming myself has occurred to me. 0    Edinburgh Postnatal Depression Scale Total 0          Health Maintenance Due  Topic Date Due   DTaP/Tdap/Td (1 - Tdap) Never done   Hepatitis B Vaccines (1 of 3 - 19+ 3-dose series) Never done   HPV VACCINES (1 - 3-dose SCDM series) Never done   COVID-19 Vaccine (1 - 2024-25 season) Never done       Review of Systems Pertinent items noted in HPI and remainder of comprehensive ROS otherwise negative.  Objective:  BP (!) 88/50   Pulse 81   Wt 192 lb (87.1 kg)   LMP  07/07/2023   Breastfeeding No   BMI 35.12 kg/m    General:  alert, cooperative, and no distress   Breasts:  normal  Lungs: clear to auscultation bilaterally  Heart:  regular rate and rhythm  Abdomen: soft, non-tender; bowel sounds normal; no masses,  no organomegaly   Wound well approximated incision  GU exam:  not indicated       Assessment:    There are no diagnoses linked to this encounter.  Normal postpartum exam.   Plan:   Essential components of care per ACOG recommendations:  1.  Mood and well being: Patient with negative depression screening today. Reviewed local resources for support.  - Patient tobacco use? No.   - hx of drug use? No.    2. Infant care and feeding:  -Patient currently breastmilk feeding? No.  -Social determinants of health (SDOH) reviewed in EPIC. No concerns  3. Sexuality, contraception and birth spacing - Patient does not want a pregnancy in the next year.  Desired family size is 2 children.  - Patient with BTL at the time of her cesarean section  - Discussed birth spacing of 18 months  4. Sleep and fatigue -Encouraged family/partner/community support of 4 hrs  of uninterrupted sleep to help with mood and fatigue  5. Physical Recovery  - Discussed patients delivery and complications. She describes her labor as good. - Patient had a C-section emergent.  - Patient has urinary incontinence? No. - Patient is safe to resume physical and sexual activity  6.  Health Maintenance - HM due items addressed Yes - Last pap smear  Diagnosis  Date Value Ref Range Status  09/17/2023 (A)  Final   - Atypical squamous cells of undetermined significance (ASC-US )   Pap smear not done at today's visit.  -Breast Cancer screening indicated? No.   7. Chronic Disease/Pregnancy Condition follow up: None  - PCP follow up  Winton Felt, MD [facility], [facility]
